# Patient Record
Sex: Female | Born: 1950 | Race: White | Hispanic: No | Marital: Married | Smoking: Never smoker
Health system: Southern US, Community
[De-identification: ages and names within clinical notes are randomized; demographics above are authoritative.]

## PROBLEM LIST (undated history)

## (undated) DIAGNOSIS — C801 Malignant (primary) neoplasm, unspecified: Secondary | ICD-10-CM

## (undated) DIAGNOSIS — I10 Essential (primary) hypertension: Secondary | ICD-10-CM

## (undated) HISTORY — PX: MASTECTOMY: SHX3

---

## 2013-06-28 DIAGNOSIS — I10 Essential (primary) hypertension: Secondary | ICD-10-CM | POA: Diagnosis present

## 2020-02-06 ENCOUNTER — Other Ambulatory Visit: Payer: Self-pay

## 2020-02-06 ENCOUNTER — Emergency Department: Payer: Medicare Other

## 2020-02-06 DIAGNOSIS — R7989 Other specified abnormal findings of blood chemistry: Secondary | ICD-10-CM | POA: Diagnosis present

## 2020-02-06 DIAGNOSIS — Z853 Personal history of malignant neoplasm of breast: Secondary | ICD-10-CM

## 2020-02-06 DIAGNOSIS — K8071 Calculus of gallbladder and bile duct without cholecystitis with obstruction: Secondary | ICD-10-CM | POA: Diagnosis present

## 2020-02-06 DIAGNOSIS — I10 Essential (primary) hypertension: Secondary | ICD-10-CM | POA: Diagnosis present

## 2020-02-06 DIAGNOSIS — Z9012 Acquired absence of left breast and nipple: Secondary | ICD-10-CM

## 2020-02-06 DIAGNOSIS — K8042 Calculus of bile duct with acute cholecystitis without obstruction: Secondary | ICD-10-CM | POA: Diagnosis not present

## 2020-02-06 DIAGNOSIS — Z79890 Hormone replacement therapy (postmenopausal): Secondary | ICD-10-CM

## 2020-02-06 DIAGNOSIS — E876 Hypokalemia: Secondary | ICD-10-CM | POA: Diagnosis present

## 2020-02-06 DIAGNOSIS — Z20822 Contact with and (suspected) exposure to covid-19: Secondary | ICD-10-CM | POA: Diagnosis present

## 2020-02-06 DIAGNOSIS — R0602 Shortness of breath: Secondary | ICD-10-CM | POA: Diagnosis not present

## 2020-02-06 DIAGNOSIS — Z79899 Other long term (current) drug therapy: Secondary | ICD-10-CM

## 2020-02-06 DIAGNOSIS — E039 Hypothyroidism, unspecified: Secondary | ICD-10-CM | POA: Diagnosis present

## 2020-02-06 DIAGNOSIS — Z885 Allergy status to narcotic agent status: Secondary | ICD-10-CM

## 2020-02-06 DIAGNOSIS — Z923 Personal history of irradiation: Secondary | ICD-10-CM

## 2020-02-06 DIAGNOSIS — Z9221 Personal history of antineoplastic chemotherapy: Secondary | ICD-10-CM

## 2020-02-06 LAB — CBC
HCT: 34.1 % — ABNORMAL LOW (ref 36.0–46.0)
Hemoglobin: 11.5 g/dL — ABNORMAL LOW (ref 12.0–15.0)
MCH: 29.1 pg (ref 26.0–34.0)
MCHC: 33.7 g/dL (ref 30.0–36.0)
MCV: 86.3 fL (ref 80.0–100.0)
Platelets: 277 10*3/uL (ref 150–400)
RBC: 3.95 MIL/uL (ref 3.87–5.11)
RDW: 13.1 % (ref 11.5–15.5)
WBC: 6.4 10*3/uL (ref 4.0–10.5)
nRBC: 0 % (ref 0.0–0.2)

## 2020-02-06 LAB — TROPONIN I (HIGH SENSITIVITY)
Troponin I (High Sensitivity): 4 ng/L (ref <18)
Troponin I (High Sensitivity): 5 ng/L (ref <18)

## 2020-02-06 LAB — BASIC METABOLIC PANEL
Anion gap: 11 (ref 5–15)
BUN: 20 mg/dL (ref 8–23)
CO2: 24 mmol/L (ref 22–32)
Calcium: 8.9 mg/dL (ref 8.9–10.3)
Chloride: 104 mmol/L (ref 98–111)
Creatinine, Ser: 1.04 mg/dL — ABNORMAL HIGH (ref 0.44–1.00)
GFR calc Af Amer: >60 mL/min (ref >60)
GFR calc non Af Amer: 55 mL/min — ABNORMAL LOW (ref >60)
Glucose, Bld: 132 mg/dL — ABNORMAL HIGH (ref 70–99)
Potassium: 2.9 mmol/L — ABNORMAL LOW (ref 3.5–5.1)
Sodium: 139 mmol/L (ref 135–145)

## 2020-02-06 NOTE — ED Triage Notes (Signed)
Pt arrives via pov from home with c/o abd pain under rib cage, and chest pain, nausea w/o emesis at this time. Pt state pain started around 30 mins ago and describes it as cramps. NAD noted at this time

## 2020-02-07 ENCOUNTER — Observation Stay: Payer: Medicare Other

## 2020-02-07 ENCOUNTER — Emergency Department: Payer: Medicare Other

## 2020-02-07 ENCOUNTER — Inpatient Hospital Stay
Admission: EM | Admit: 2020-02-07 | Discharge: 2020-02-10 | DRG: 419 | Disposition: A | Discharge status: Home / Self Care | Payer: Medicare Other | Attending: Internal Medicine | Admitting: Internal Medicine

## 2020-02-07 DIAGNOSIS — Z79899 Other long term (current) drug therapy: Secondary | ICD-10-CM | POA: Diagnosis not present

## 2020-02-07 DIAGNOSIS — R0602 Shortness of breath: Secondary | ICD-10-CM | POA: Diagnosis present

## 2020-02-07 DIAGNOSIS — Z853 Personal history of malignant neoplasm of breast: Secondary | ICD-10-CM | POA: Diagnosis not present

## 2020-02-07 DIAGNOSIS — R1011 Right upper quadrant pain: Secondary | ICD-10-CM

## 2020-02-07 DIAGNOSIS — E039 Hypothyroidism, unspecified: Secondary | ICD-10-CM | POA: Diagnosis present

## 2020-02-07 DIAGNOSIS — I10 Essential (primary) hypertension: Secondary | ICD-10-CM | POA: Diagnosis not present

## 2020-02-07 DIAGNOSIS — Z885 Allergy status to narcotic agent status: Secondary | ICD-10-CM | POA: Diagnosis not present

## 2020-02-07 DIAGNOSIS — K8050 Calculus of bile duct without cholangitis or cholecystitis without obstruction: Secondary | ICD-10-CM | POA: Diagnosis not present

## 2020-02-07 DIAGNOSIS — Z20822 Contact with and (suspected) exposure to covid-19: Secondary | ICD-10-CM | POA: Diagnosis present

## 2020-02-07 DIAGNOSIS — E876 Hypokalemia: Secondary | ICD-10-CM | POA: Diagnosis present

## 2020-02-07 DIAGNOSIS — K8071 Calculus of gallbladder and bile duct without cholecystitis with obstruction: Secondary | ICD-10-CM

## 2020-02-07 DIAGNOSIS — R748 Abnormal levels of other serum enzymes: Secondary | ICD-10-CM | POA: Diagnosis not present

## 2020-02-07 DIAGNOSIS — R17 Unspecified jaundice: Secondary | ICD-10-CM

## 2020-02-07 DIAGNOSIS — K8042 Calculus of bile duct with acute cholecystitis without obstruction: Secondary | ICD-10-CM | POA: Diagnosis present

## 2020-02-07 DIAGNOSIS — R7989 Other specified abnormal findings of blood chemistry: Secondary | ICD-10-CM | POA: Diagnosis present

## 2020-02-07 DIAGNOSIS — Z9012 Acquired absence of left breast and nipple: Secondary | ICD-10-CM | POA: Diagnosis not present

## 2020-02-07 DIAGNOSIS — Z9221 Personal history of antineoplastic chemotherapy: Secondary | ICD-10-CM | POA: Diagnosis not present

## 2020-02-07 DIAGNOSIS — Z923 Personal history of irradiation: Secondary | ICD-10-CM | POA: Diagnosis not present

## 2020-02-07 DIAGNOSIS — Z79890 Hormone replacement therapy (postmenopausal): Secondary | ICD-10-CM | POA: Diagnosis not present

## 2020-02-07 HISTORY — DX: Malignant (primary) neoplasm, unspecified: C80.1

## 2020-02-07 HISTORY — DX: Essential (primary) hypertension: I10

## 2020-02-07 LAB — CBC
HCT: 31 % — ABNORMAL LOW (ref 36.0–46.0)
Hemoglobin: 10.4 g/dL — ABNORMAL LOW (ref 12.0–15.0)
MCH: 29.2 pg (ref 26.0–34.0)
MCHC: 33.5 g/dL (ref 30.0–36.0)
MCV: 87.1 fL (ref 80.0–100.0)
Platelets: 211 10*3/uL (ref 150–400)
RBC: 3.56 MIL/uL — ABNORMAL LOW (ref 3.87–5.11)
RDW: 13.3 % (ref 11.5–15.5)
WBC: 5.6 10*3/uL (ref 4.0–10.5)
nRBC: 0 % (ref 0.0–0.2)

## 2020-02-07 LAB — COMPREHENSIVE METABOLIC PANEL
ALT: 123 U/L — ABNORMAL HIGH (ref 0–44)
AST: 275 U/L — ABNORMAL HIGH (ref 15–41)
Albumin: 4.1 g/dL (ref 3.5–5.0)
Alkaline Phosphatase: 157 U/L — ABNORMAL HIGH (ref 38–126)
Anion gap: 12 (ref 5–15)
BUN: 19 mg/dL (ref 8–23)
CO2: 23 mmol/L (ref 22–32)
Calcium: 8.8 mg/dL — ABNORMAL LOW (ref 8.9–10.3)
Chloride: 103 mmol/L (ref 98–111)
Creatinine, Ser: 0.93 mg/dL (ref 0.44–1.00)
GFR calc Af Amer: >60 mL/min (ref >60)
GFR calc non Af Amer: >60 mL/min (ref >60)
Glucose, Bld: 127 mg/dL — ABNORMAL HIGH (ref 70–99)
Potassium: 3.9 mmol/L (ref 3.5–5.1)
Sodium: 138 mmol/L (ref 135–145)
Total Bilirubin: 2.3 mg/dL — ABNORMAL HIGH (ref 0.3–1.2)
Total Protein: 7.9 g/dL (ref 6.5–8.1)

## 2020-02-07 LAB — HEPATIC FUNCTION PANEL
ALT: 55 U/L — ABNORMAL HIGH (ref 0–44)
AST: 176 U/L — ABNORMAL HIGH (ref 15–41)
Albumin: 4.1 g/dL (ref 3.5–5.0)
Alkaline Phosphatase: 121 U/L (ref 38–126)
Bilirubin, Direct: 0.6 mg/dL — ABNORMAL HIGH (ref 0.0–0.2)
Indirect Bilirubin: 0.8 mg/dL (ref 0.3–0.9)
Total Bilirubin: 1.4 mg/dL — ABNORMAL HIGH (ref 0.3–1.2)
Total Protein: 7.9 g/dL (ref 6.5–8.1)

## 2020-02-07 LAB — MAGNESIUM: Magnesium: 1.8 mg/dL (ref 1.7–2.4)

## 2020-02-07 LAB — SARS CORONAVIRUS 2 BY RT PCR (HOSPITAL ORDER, PERFORMED IN ~~LOC~~ HOSPITAL LAB): SARS Coronavirus 2: NEGATIVE

## 2020-02-07 LAB — HIV ANTIBODY (ROUTINE TESTING W REFLEX): HIV Screen 4th Generation wRfx: NONREACTIVE

## 2020-02-07 MED ORDER — LABETALOL HCL 5 MG/ML IV SOLN
10.0000 mg | INTRAVENOUS | Status: DC | PRN
  Administered 2020-02-07: 10 mg via INTRAVENOUS
  Filled 2020-02-07: qty 4

## 2020-02-07 MED ORDER — ACETAMINOPHEN 325 MG PO TABS: 650.0000 mg | ORAL_TABLET | Freq: Four times a day (QID) | ORAL | Status: DC | PRN

## 2020-02-07 MED ORDER — MORPHINE SULFATE (PF) 2 MG/ML IV SOLN
INTRAVENOUS | Status: AC
  Filled 2020-02-07: qty 1

## 2020-02-07 MED ORDER — GADOBUTROL 1 MMOL/ML IV SOLN
7.5000 mL | Freq: Once | INTRAVENOUS | Status: AC | PRN
  Administered 2020-02-07: 7.5 mL via INTRAVENOUS

## 2020-02-07 MED ORDER — ONDANSETRON HCL 4 MG PO TABS
4.0000 mg | ORAL_TABLET | Freq: Four times a day (QID) | ORAL | Status: DC | PRN
  Administered 2020-02-08: 4 mg via ORAL
  Filled 2020-02-07: qty 1

## 2020-02-07 MED ORDER — POTASSIUM CHLORIDE CRYS ER 20 MEQ PO TBCR: 40.0000 meq | EXTENDED_RELEASE_TABLET | Freq: Once | ORAL | Status: DC

## 2020-02-07 MED ORDER — POTASSIUM CHLORIDE 10 MEQ/100ML IV SOLN
10.0000 meq | INTRAVENOUS | Status: AC
  Administered 2020-02-07 (×2): 10 meq via INTRAVENOUS
  Filled 2020-02-07 (×3): qty 100

## 2020-02-07 MED ORDER — MORPHINE SULFATE (PF) 2 MG/ML IV SOLN: 2.0000 mg | INTRAVENOUS | Status: DC | PRN

## 2020-02-07 MED ORDER — SODIUM CHLORIDE 0.9 % IV BOLUS
1000.0000 mL | Freq: Once | INTRAVENOUS | Status: AC
  Administered 2020-02-07: 1000 mL via INTRAVENOUS

## 2020-02-07 MED ORDER — ACETAMINOPHEN 650 MG RE SUPP: 650.0000 mg | Freq: Four times a day (QID) | RECTAL | Status: DC | PRN

## 2020-02-07 MED ORDER — MORPHINE SULFATE (PF) 2 MG/ML IV SOLN
2.0000 mg | Freq: Once | INTRAVENOUS | Status: AC
  Administered 2020-02-07: 2 mg via INTRAVENOUS

## 2020-02-07 MED ORDER — PIPERACILLIN-TAZOBACTAM 3.375 G IVPB
3.3750 g | Freq: Three times a day (TID) | INTRAVENOUS | Status: DC
  Administered 2020-02-07 – 2020-02-10 (×10): 3.375 g via INTRAVENOUS
  Filled 2020-02-07 (×10): qty 50

## 2020-02-07 MED ORDER — SODIUM CHLORIDE 0.9 % IV SOLN: INTRAVENOUS | Status: DC

## 2020-02-07 MED ORDER — ONDANSETRON HCL 4 MG/2ML IJ SOLN
4.0000 mg | Freq: Once | INTRAMUSCULAR | Status: AC
  Administered 2020-02-07: 4 mg via INTRAVENOUS
  Filled 2020-02-07: qty 2

## 2020-02-07 MED ORDER — ONDANSETRON HCL 4 MG/2ML IJ SOLN
4.0000 mg | Freq: Four times a day (QID) | INTRAMUSCULAR | Status: DC | PRN
  Administered 2020-02-09: 4 mg via INTRAVENOUS

## 2020-02-07 NOTE — ED Notes (Signed)
MD assessing patient in triage 3 with this RN and IT trainer. Patient c/o upper abdominal pain and emesis after eating at [facility]. Patient currently at 4-5/10 abdominal pain. Patient reports 3 emeses in the last 24 hours.

## 2020-02-07 NOTE — ED Notes (Signed)
Provider notified of BP of 173/94.

## 2020-02-07 NOTE — Progress Notes (Signed)
PROGRESS NOTE    Brittney Harris  WCH:[id number] DOB: [birth year] DOA: 02/07/2020 PCP: System, Provider Not In    Assessment & Plan:   Active Problems:   Choledocholithiasis   Hypertension   Hypothyroidism    Brittney Harris is a 68 y.o. female with medical history significant for HTN, hypothyroidism and history of left breast cancer status post chemoradiation and mastectomy with reconstruction in 2012 who presents to the emergency room with sudden onset mid and right upper quadrant pain of moderate to severe intensity, nonradiating, crampy in nature that started after eating at a restaurant.  It was associated with 3 episodes of vomiting of gastric contents.  No associated diarrhea.  No fever or chills.    Acute biliary colic secondary to choledocholithiasis -Patient presents with sudden onset RUQ pain and vomiting following a meal at a restaurant with elevated LFTs on work-up and dilated CBD on ultrasound --zosyn started on presentation -MRCP showed 3 mm stone in the common bile duct PLAN: --continue zosyn --pain control, IV antiemetics --continue MIVF@100  -clear liquid diet --ERCP tomorrow --consider surgery consult to evaluate for cholecystectomy to prevent future episodes of choledocholithiasis  Hypokalemia -recheck and replete PRN    Hypertension -Not on regular BP medication at home.  BP labile. --Hold BP meds for now.    Hypothyroidism -resume home synthroid   DVT prophylaxis: SCD/Compression stockings Code Status: Full code  Family Communication:  Status is: inpatient Dispo:   The patient is from: home Anticipated d/c is to: home Anticipated d/c date is: 2-3 days Patient currently is not medically stable to d/c due to: need ERCP tomorrow   Subjective and Interval History:  Pt reported improvement in N/V and abdominal pain.     Objective: Vitals:   02/07/20 1800 02/07/20 2133 02/07/20 2345 02/08/20 0000  BP: (!) 150/77 (!) 141/76  (!) 142/69   Pulse: 73 76 79   Resp: 18 18    Temp:      TempSrc:      SpO2: 100% 98% 95%   Weight:      Height:       No intake or output data in the 24 hours ending 02/08/20 0135 Filed Weights   02/06/20 1529  Weight: 90.7 kg    Examination:   Constitutional: NAD, AAOx3 HEENT: conjunctivae and lids normal, EOMI CV: RRR no M,R,G. Distal pulses +2.  No cyanosis.   RESP: CTA B/L, normal respiratory effort  GI: +BS, NTND, soft Extremities: No effusions, edema, or tenderness in BLE SKIN: warm, dry and intact Neuro: II - XII grossly intact.  Sensation intact Psych: Normal mood and affect.  Appropriate judgement and reason   Data Reviewed: I have personally reviewed following labs and imaging studies  CBC: Recent Labs  Lab 02/06/20 1535 02/07/20 1111  WBC 6.4 5.6  HGB 11.5* 10.4*  HCT 34.1* 31.0*  MCV 86.3 87.1  PLT 277 353   Basic Metabolic Panel: Recent Labs  Lab 02/06/20 1535 02/07/20 0326 02/07/20 1111  NA 139 138  --   K 2.9* 3.9  --   CL 104 103  --   CO2 24 23  --   GLUCOSE 132* 127*  --   BUN 20 19  --   CREATININE 1.04* 0.93  --   CALCIUM 8.9 8.8*  --   MG  --   --  1.8   GFR: Estimated Creatinine Clearance: 62.6 mL/min (by C-G formula based on SCr of 0.93 mg/dL). Liver Function Tests: Recent  Labs  Lab 02/06/20 1809 02/07/20 0326  AST 176* 275*  ALT 55* 123*  ALKPHOS 121 157*  BILITOT 1.4* 2.3*  PROT 7.9 7.9  ALBUMIN 4.1 4.1   No results for input(s): LIPASE, AMYLASE in the last 168 hours. No results for input(s): AMMONIA in the last 168 hours. Coagulation Profile: No results for input(s): INR, PROTIME in the last 168 hours. Cardiac Enzymes: No results for input(s): CKTOTAL, CKMB, CKMBINDEX, TROPONINI in the last 168 hours. BNP (last 3 results) No results for input(s): PROBNP in the last 8760 hours. HbA1C: No results for input(s): HGBA1C in the last 72 hours. CBG: No results for input(s): GLUCAP in the last 168 hours. Lipid Profile: No  results for input(s): CHOL, HDL, LDLCALC, TRIG, CHOLHDL, LDLDIRECT in the last 72 hours. Thyroid Function Tests: No results for input(s): TSH, T4TOTAL, FREET4, T3FREE, THYROIDAB in the last 72 hours. Anemia Panel: No results for input(s): VITAMINB12, FOLATE, FERRITIN, TIBC, IRON, RETICCTPCT in the last 72 hours. Sepsis Labs: No results for input(s): PROCALCITON, LATICACIDVEN in the last 168 hours.  Recent Results (from the past 240 hour(s))  SARS Coronavirus 2 by RT PCR (hospital order, performed in [facility] hospital lab) Nasopharyngeal Nasopharyngeal Swab     Status: None   Collection Time: 02/07/20  3:26 AM   Specimen: Nasopharyngeal Swab  Result Value Ref Range Status   SARS Coronavirus 2 NEGATIVE NEGATIVE Final    Comment: (NOTE) SARS-CoV-2 target nucleic acids are NOT DETECTED.  The SARS-CoV-2 RNA is generally detectable in upper and lower respiratory specimens during the acute phase of infection. The lowest concentration of SARS-CoV-2 viral copies this assay can detect is 250 copies / mL. A negative result does not preclude SARS-CoV-2 infection and should not be used as the sole basis for treatment or other patient management decisions.  A negative result may occur with improper specimen collection / handling, submission of specimen other than nasopharyngeal swab, presence of viral mutation(s) within the areas targeted by this assay, and inadequate number of viral copies (<250 copies / mL). A negative result must be combined with clinical observations, patient history, and epidemiological information.  Fact Sheet for Patients:   StrictlyIdeas.no  Fact Sheet for Healthcare Providers: BankingDealers.co.za  This test is not yet approved or  cleared by the [place] FDA and has been authorized for detection and/or diagnosis of SARS-CoV-2 by FDA under an Emergency Use Authorization (EUA).  This EUA will remain in effect  (meaning this test can be used) for the duration of the COVID-19 declaration under Section 564(b)(1) of the Act, 21 U.S.C. section 360bbb-3(b)(1), unless the authorization is terminated or revoked sooner.  Performed at [facility], [address], [place], [place] [address]       Radiology Studies: DG Chest 2 View  Result Date: 02/06/2020 CLINICAL DATA:  Acute shortness of breath. EXAM: CHEST - 2 VIEW COMPARISON:  None. FINDINGS: The cardiomediastinal silhouette is unremarkable. Surgical changes and scarring overlying the UPPER LEFT lung noted. There is no evidence of focal airspace disease, pulmonary edema, suspicious pulmonary nodule/mass, pleural effusion, or pneumothorax. No acute bony abnormalities are identified. IMPRESSION: No active cardiopulmonary disease. Electronically Signed   By: Margarette Canada M.D.   On: 02/06/2020 16:17   MR 3D Recon At Scanner  Result Date: 02/07/2020 CLINICAL DATA:  History of breast cancer. Acute onset of right upper abdominal pain with nausea. EXAM: MRI ABDOMEN WITHOUT AND WITH CONTRAST (INCLUDING MRCP) TECHNIQUE: Multiplanar multisequence MR imaging of the abdomen  was performed both before and after the administration of intravenous contrast. Heavily T2-weighted images of the biliary and pancreatic ducts were obtained, and three-dimensional MRCP images were rendered by post processing. CONTRAST:  7.2mL GADAVIST GADOBUTROL 1 MMOL/ML IV SOLN COMPARISON:  None. FINDINGS: Lower chest: No acute findings. Hepatobiliary: No focal liver lesions identified. There are multiple small stones identified layering within the gallbladder measuring up to 4 mm. Gallbladder wall is upper limits of normal in thickness measuring 3 mm. No surrounding inflammatory changes. Mild intrahepatic biliary dilatation. Fusiform dilatation of the common bile duct is identified which measures up to 1.2 cm, image 11/4. There may be a single tiny stone identified within the distal common  bile duct measuring approximately 3 mm, image 11/14. No additional signs of choledocholithiasis or obstructing mass identified. Pancreas: The main duct is upper limits of normal caliber measuring 3 mm, image 41/2. No pancreatic inflammation or mass identified. Spleen:  Within normal limits in size and appearance. Adrenals/Urinary Tract: No masses identified. No evidence of hydronephrosis. Stomach/Bowel: Small to the moderate size hiatal hernia noted. The visualized bowel loops within the upper abdomen are unremarkable. Vascular/Lymphatic: No pathologically enlarged lymph nodes identified. No abdominal aortic aneurysm demonstrated. Other:  No free fluid or fluid collections Musculoskeletal: No suspicious bone lesions identified. IMPRESSION: 1. Gallstones. Gallbladder wall is upper limits of normal in thickness. No surrounding inflammatory changes to suggest acute cholecystitis. 2. Fusiform dilatation of the common bile duct which measures up to 1.2 cm. Mild intrahepatic biliary dilatation is also noted. Suspect a single tiny stone identified with the distal common bile duct measuring approximately 3 mm. No additional signs of choledocholithiasis or obstructing mass identified. 3. Small to moderate size hiatal hernia. Electronically Signed   By: Kerby Moors M.D.   On: 02/07/2020 06:40   MR ABDOMEN MRCP W WO CONTAST  Result Date: 02/07/2020 CLINICAL DATA:  History of breast cancer. Acute onset of right upper abdominal pain with nausea. EXAM: MRI ABDOMEN WITHOUT AND WITH CONTRAST (INCLUDING MRCP) TECHNIQUE: Multiplanar multisequence MR imaging of the abdomen was performed both before and after the administration of intravenous contrast. Heavily T2-weighted images of the biliary and pancreatic ducts were obtained, and three-dimensional MRCP images were rendered by post processing. CONTRAST:  7.43mL GADAVIST GADOBUTROL 1 MMOL/ML IV SOLN COMPARISON:  None. FINDINGS: Lower chest: No acute findings. Hepatobiliary: No  focal liver lesions identified. There are multiple small stones identified layering within the gallbladder measuring up to 4 mm. Gallbladder wall is upper limits of normal in thickness measuring 3 mm. No surrounding inflammatory changes. Mild intrahepatic biliary dilatation. Fusiform dilatation of the common bile duct is identified which measures up to 1.2 cm, image 11/4. There may be a single tiny stone identified within the distal common bile duct measuring approximately 3 mm, image 11/14. No additional signs of choledocholithiasis or obstructing mass identified. Pancreas: The main duct is upper limits of normal caliber measuring 3 mm, image 41/2. No pancreatic inflammation or mass identified. Spleen:  Within normal limits in size and appearance. Adrenals/Urinary Tract: No masses identified. No evidence of hydronephrosis. Stomach/Bowel: Small to the moderate size hiatal hernia noted. The visualized bowel loops within the upper abdomen are unremarkable. Vascular/Lymphatic: No pathologically enlarged lymph nodes identified. No abdominal aortic aneurysm demonstrated. Other:  No free fluid or fluid collections Musculoskeletal: No suspicious bone lesions identified. IMPRESSION: 1. Gallstones. Gallbladder wall is upper limits of normal in thickness. No surrounding inflammatory changes to suggest acute cholecystitis. 2. Fusiform dilatation of the common  bile duct which measures up to 1.2 cm. Mild intrahepatic biliary dilatation is also noted. Suspect a single tiny stone identified with the distal common bile duct measuring approximately 3 mm. No additional signs of choledocholithiasis or obstructing mass identified. 3. Small to moderate size hiatal hernia. Electronically Signed   By: Kerby Moors M.D.   On: 02/07/2020 06:40   US ABDOMEN LIMITED RUQ  Result Date: 02/07/2020 CLINICAL DATA:  69 year old female with right upper quadrant abdominal pain. EXAM: ULTRASOUND ABDOMEN LIMITED RIGHT UPPER QUADRANT COMPARISON:   None. FINDINGS: Gallbladder: There multiple stones within the gallbladder. No gallbladder wall thickening or pericholecystic fluid. Negative sonographic Murphy's sign. Several echogenic foci with comet tail artifact in the region of the gallbladder fundus consistent with adenomyomatosis. Common bile duct: Diameter: 11 mm. The common bile duct is distended. No stone identified in the visualized CBD. Liver: The liver is grossly unremarkable. Portal vein is patent on color Doppler imaging with normal direction of blood flow towards the liver. Other: None. IMPRESSION: 1. Cholelithiasis without sonographic evidence of acute cholecystitis. 2. Dilated common bile duct. No stone identified in the visualized CBD. A centrally obstructing calculus is not excluded. Electronically Signed   By: Anner Crete M.D.   On: 02/07/2020 01:32     Scheduled Meds: Continuous Infusions: . sodium chloride 100 mL/hr at 02/08/20 0054  . piperacillin-tazobactam Stopped (02/08/20 0054)     LOS: 1 day     Enzo Bi, MD [facility] If 7PM-7AM, please contact night-coverage 02/08/2020, 1:35 AM

## 2020-02-07 NOTE — H&P (Signed)
History and Physical    Brittney Harris ZOX:[id number] DOB: 06-29-1950 DOA: 02/07/2020  PCP: No primary care provider on file.   Patient coming from: Home  I have personally briefly reviewed patient's old medical records in [place]  Chief Complaint: Abdominal pain  HPI: Brittney Harris is a 69 y.o. female with medical history significant for HTN, hypothyroidism and history of left breast cancer status post chemoradiation and mastectomy with reconstruction in 2012 who presents to the emergency room with sudden onset mid and right upper quadrant pain of moderate to severe intensity, nonradiating, crampy in nature that started after eating at a restaurant.  It was associated with 3 episodes of vomiting of gastric contents.  No associated diarrhea.  No fever or chills.  No chest pain shortness of breath and no cough.  No affected contacts. ED Course: On arrival, borderline low blood pressure of 95/67 with otherwise normal vitals.  Blood work significant for hype Po kalemia of 2.9, mildly elevated creatinine of 1.04.  AST 176, ALT 55, total bilirubin 1.4, hemoglobin 11.5. Right upper quadrant sonogram showed cholelithiasis without evidence of acute cholecystitis as well as a dilated common bile duct of 11 mm but no stone identified.  The emergency room provider spoke with GI, Dr. Bonna Gains who recommended MRCP for consideration of possible ERCP based on findings.  Patient started on IV Zosyn.  Hospitalist consulted for admission.  Review of Systems: As per HPI otherwise all other systems on review of systems negative.    Past Medical History:  Diagnosis Date  . Cancer [facility])     Past Surgical History:  Procedure Laterality Date  . MASTECTOMY Left      reports that she has never smoked. She has never used smokeless tobacco. She reports that she does not drink alcohol and does not use drugs.  Not on File  History reviewed. No pertinent family history.    Prior to Admission  medications   Not on File    Physical Exam: Vitals:   02/06/20 1528 02/06/20 1529 02/06/20 2342 02/07/20 0317  BP: 95/67  (!) 184/77 (!) 156/100  Pulse: (!) 57  74 87  Resp: 18  18 16   Temp: 97.6 F (36.4 C)     TempSrc: Oral     SpO2: 100%  99% 98%  Weight:  90.7 kg    Height:  5\' 5"  (1.651 m)       Vitals:   02/06/20 1528 02/06/20 1529 02/06/20 2342 02/07/20 0317  BP: 95/67  (!) 184/77 (!) 156/100  Pulse: (!) 57  74 87  Resp: 18  18 16   Temp: 97.6 F (36.4 C)     TempSrc: Oral     SpO2: 100%  99% 98%  Weight:  90.7 kg    Height:  5\' 5"  (1.651 m)        Constitutional: Alert and oriented x 3 . Not in any apparent distress HEENT:      Head: Normocephalic and atraumatic.         Eyes: PERLA, EOMI, Conjunctivae are normal. Sclera is non-icteric.       Mouth/Throat: Mucous membranes are moist.       Neck: Supple with no signs of meningismus. Cardiovascular: Regular rate and rhythm. No murmurs, gallops, or rubs. 2+ symmetrical distal pulses are present . No JVD. No LE edema Respiratory: Respiratory effort normal .Lungs sounds clear bilaterally. No wheezes, crackles, or rhonchi.  Gastrointestinal:  RUQ tenderness, mild, and non distended with  positive bowel sounds. Genitourinary: No CVA tenderness. Musculoskeletal: Nontender with normal range of motion in all extremities. No cyanosis, or erythema of extremities. Neurologic: Normal speech and language. Face is symmetric. Moving all extremities. No gross focal neurologic deficits . Skin: Skin is warm, dry.  No rash or ulcers Psychiatric: Mood and affect are normal Speech and behavior are normal   Labs on Admission: I have personally reviewed following labs and imaging studies  CBC: Recent Labs  Lab 02/06/20 1535  WBC 6.4  HGB 11.5*  HCT 34.1*  MCV 86.3  PLT 329   Basic Metabolic Panel: Recent Labs  Lab 02/06/20 1535  NA 139  K 2.9*  CL 104  CO2 24  GLUCOSE 132*  BUN 20  CREATININE 1.04*  CALCIUM 8.9    GFR: Estimated Creatinine Clearance: 56.8 mL/min (A) (by C-G formula based on SCr of 1.04 mg/dL (H)). Liver Function Tests: Recent Labs  Lab 02/06/20 1809  AST 176*  ALT 55*  ALKPHOS 121  BILITOT 1.4*  PROT 7.9  ALBUMIN 4.1   No results for input(s): LIPASE, AMYLASE in the last 168 hours. No results for input(s): AMMONIA in the last 168 hours. Coagulation Profile: No results for input(s): INR, PROTIME in the last 168 hours. Cardiac Enzymes: No results for input(s): CKTOTAL, CKMB, CKMBINDEX, TROPONINI in the last 168 hours. BNP (last 3 results) No results for input(s): PROBNP in the last 8760 hours. HbA1C: No results for input(s): HGBA1C in the last 72 hours. CBG: No results for input(s): GLUCAP in the last 168 hours. Lipid Profile: No results for input(s): CHOL, HDL, LDLCALC, TRIG, CHOLHDL, LDLDIRECT in the last 72 hours. Thyroid Function Tests: No results for input(s): TSH, T4TOTAL, FREET4, T3FREE, THYROIDAB in the last 72 hours. Anemia Panel: No results for input(s): VITAMINB12, FOLATE, FERRITIN, TIBC, IRON, RETICCTPCT in the last 72 hours. Urine analysis: No results found for: COLORURINE, APPEARANCEUR, LABSPEC, PHURINE, GLUCOSEU, HGBUR, BILIRUBINUR, KETONESUR, PROTEINUR, UROBILINOGEN, NITRITE, LEUKOCYTESUR  Radiological Exams on Admission: DG Chest 2 View  Result Date: 02/06/2020 CLINICAL DATA:  Acute shortness of breath. EXAM: CHEST - 2 VIEW COMPARISON:  None. FINDINGS: The cardiomediastinal silhouette is unremarkable. Surgical changes and scarring overlying the UPPER LEFT lung noted. There is no evidence of focal airspace disease, pulmonary edema, suspicious pulmonary nodule/mass, pleural effusion, or pneumothorax. No acute bony abnormalities are identified. IMPRESSION: No active cardiopulmonary disease. Electronically Signed   By: Margarette Canada M.D.   On: 02/06/2020 16:17   US ABDOMEN LIMITED RUQ  Result Date: 02/07/2020 CLINICAL DATA:  68 year old female with right  upper quadrant abdominal pain. EXAM: ULTRASOUND ABDOMEN LIMITED RIGHT UPPER QUADRANT COMPARISON:  None. FINDINGS: Gallbladder: There multiple stones within the gallbladder. No gallbladder wall thickening or pericholecystic fluid. Negative sonographic Murphy's sign. Several echogenic foci with comet tail artifact in the region of the gallbladder fundus consistent with adenomyomatosis. Common bile duct: Diameter: 11 mm. The common bile duct is distended. No stone identified in the visualized CBD. Liver: The liver is grossly unremarkable. Portal vein is patent on color Doppler imaging with normal direction of blood flow towards the liver. Other: None. IMPRESSION: 1. Cholelithiasis without sonographic evidence of acute cholecystitis. 2. Dilated common bile duct. No stone identified in the visualized CBD. A centrally obstructing calculus is not excluded. Electronically Signed   By: Anner Crete M.D.   On: 02/07/2020 01:32    Assessment/Plan 69 year old female with a history of HTN, hypothyroidism and breast cancer status postmastectomy presenting with abdominal pain and vomiting  Acute biliary colic secondary to choledocholithiasis -Patient presents with sudden onset RUQ pain and vomiting following a meal at a restaurant with elevated LFTs on work-up and dilated CBD on ultrasound -Follow-up MRCP ordered from the emergency room -IV Zosyn, pain control, IV antiemetics -IV fluids -GI consulted  Hypokalemia -IV supplementation    Hypertension -Hold home antihypertensives as BP borderline low at 95/67 on arrival    Hypothyroidism -Hold home meds for now as n.p.o.    DVT prophylaxis: SCDs, as possible procedure in the a.m. Code Status: full code  Family Communication:  none  Disposition Plan: Back to previous home environment Consults called: GI Status: Observation    Athena Masse MD [facility]     02/07/2020, 3:23 AM

## 2020-02-07 NOTE — ED Provider Notes (Signed)
[facility] Emergency Department Provider Note  ____________________________________________   First MD Initiated Contact with Patient 02/07/20 [phone]     (approximate)  I have reviewed the triage vital signs and the nursing notes.   HISTORY  Chief Complaint Chest Pain and Shortness of Breath   HPI Brittney Harris is a 69 y.o. female with history of breast cancer presents to the emergency department secondary to acute onset of right upper abdominal pain with associated nausea 30 minutes before arrival to the emergency department.  Patient states that she ate approximately 1 hour before onset of symptoms.  Patient admits to vomiting however no diarrhea.  Patient denies any fever.  Patient denies any constipation.  Patient denies any urinary symptoms.        Past Medical History:  Diagnosis Date  . Cancer [facility])     Patient Active Problem List   Diagnosis Date Noted  . Choledocholithiasis 02/07/2020  . Hypothyroidism 02/07/2020  . History of left breast cancer 02/07/2020  . Hypertension 06/28/2013    Past Surgical History:  Procedure Laterality Date  . MASTECTOMY Left     Prior to Admission medications   Medication Sig Start Date End Date Taking? Authorizing Provider  levothyroxine (SYNTHROID) 100 MCG tablet Take 100 mcg by mouth daily. 02/06/20  Yes [provider]    Allergies Patient has no allergy information on record.  History reviewed. No pertinent family history.  Social History Social History   Tobacco Use  . Smoking status: Never Smoker  . Smokeless tobacco: Never Used  Vaping Use  . Vaping Use: Never used  Substance Use Topics  . Alcohol use: Never  . Drug use: Never    Review of Systems Constitutional: No fever/chills Eyes: No visual changes. ENT: No sore throat. Cardiovascular: Denies chest pain. Respiratory: Denies shortness of breath. Gastrointestinal: Positive for abdominal pain and  nausea. Genitourinary: Negative for dysuria. Musculoskeletal: Negative for neck pain.  Negative for back pain. Integumentary: Negative for rash. Neurological: Negative for headaches, focal weakness or numbness.  ____________________________________________   PHYSICAL EXAM:  VITAL SIGNS: ED Triage Vitals  Enc Vitals Group     BP 02/06/20 1528 95/67     Pulse Rate 02/06/20 1528 (!) 57     Resp 02/06/20 1528 18     Temp 02/06/20 1528 97.6 F (36.4 C)     Temp Source 02/06/20 1528 Oral     SpO2 02/06/20 1528 100 %     Weight 02/06/20 1529 90.7 kg (200 lb)     Height 02/06/20 1529 1.651 m (5\' 5" )     Head Circumference --      Peak Flow --      Pain Score 02/06/20 1529 6     Pain Loc --      Pain Edu? --      Excl. in [place]? --     Constitutional: Alert and oriented.  Eyes: Conjunctivae are normal.  Head: Atraumatic. Mouth/Throat: Patient is wearing a mask. Neck: No stridor.  No meningeal signs.   Cardiovascular: Normal rate, regular rhythm. Good peripheral circulation. Grossly normal heart sounds. Respiratory: Normal respiratory effort.  No retractions. Gastrointestinal: Right upper quadrant tenderness to palpation, no guarding or rebound. Musculoskeletal: No lower extremity tenderness nor edema. No gross deformities of extremities. Neurologic:  Normal speech and language. No gross focal neurologic deficits are appreciated.  Skin:  Skin is warm, dry and intact. Psychiatric: Mood and affect are normal. Speech and behavior are normal.  ____________________________________________   LABS (all labs ordered are listed, but only abnormal results are displayed)  Labs Reviewed  BASIC METABOLIC PANEL - Abnormal; Notable for the following components:      Result Value   Potassium 2.9 (*)    Glucose, Bld 132 (*)    Creatinine, Ser 1.04 (*)    GFR calc non Af Amer 55 (*)    All other components within normal limits  CBC - Abnormal; Notable for the following components:    Hemoglobin 11.5 (*)    HCT 34.1 (*)    All other components within normal limits  HEPATIC FUNCTION PANEL - Abnormal; Notable for the following components:   AST 176 (*)    ALT 55 (*)    Total Bilirubin 1.4 (*)    Bilirubin, Direct 0.6 (*)    All other components within normal limits  COMPREHENSIVE METABOLIC PANEL  HIV ANTIBODY (ROUTINE TESTING W REFLEX)  TROPONIN I (HIGH SENSITIVITY)  TROPONIN I (HIGH SENSITIVITY)   _______________________________________  RADIOLOGY I, Viola N Anvith Mauriello, personally viewed and evaluated these images (plain radiographs) as part of my medical decision making, as well as reviewing the written report by the radiologist.  ED MD interpretation: No active cardiopulmonary disease noted on chest x-ray per radiologist.  Right upper quadrant ultrasound revealed cholelithiasis without evidence of acute cholecystitis.  Dilated common bile duct at 11 mm.  No stones visualized in the CBD per radiologist.  Official radiology report(s): DG Chest 2 View  Result Date: 02/06/2020 CLINICAL DATA:  Acute shortness of breath. EXAM: CHEST - 2 VIEW COMPARISON:  None. FINDINGS: The cardiomediastinal silhouette is unremarkable. Surgical changes and scarring overlying the UPPER LEFT lung noted. There is no evidence of focal airspace disease, pulmonary edema, suspicious pulmonary nodule/mass, pleural effusion, or pneumothorax. No acute bony abnormalities are identified. IMPRESSION: No active cardiopulmonary disease. Electronically Signed   By: Margarette Canada M.D.   On: 02/06/2020 16:17   US ABDOMEN LIMITED RUQ  Result Date: 02/07/2020 CLINICAL DATA:  69 year old female with right upper quadrant abdominal pain. EXAM: ULTRASOUND ABDOMEN LIMITED RIGHT UPPER QUADRANT COMPARISON:  None. FINDINGS: Gallbladder: There multiple stones within the gallbladder. No gallbladder wall thickening or pericholecystic fluid. Negative sonographic Murphy's sign. Several echogenic foci with comet tail artifact  in the region of the gallbladder fundus consistent with adenomyomatosis. Common bile duct: Diameter: 11 mm. The common bile duct is distended. No stone identified in the visualized CBD. Liver: The liver is grossly unremarkable. Portal vein is patent on color Doppler imaging with normal direction of blood flow towards the liver. Other: None. IMPRESSION: 1. Cholelithiasis without sonographic evidence of acute cholecystitis. 2. Dilated common bile duct. No stone identified in the visualized CBD. A centrally obstructing calculus is not excluded. Electronically Signed   By: Anner Crete M.D.   On: 02/07/2020 01:32     Procedures   ____________________________________________   INITIAL IMPRESSION / MDM / ASSESSMENT AND PLAN / ED COURSE  As part of my medical decision making, I reviewed the following data within the electronic MEDICAL RECORD NUMBER  69 year old female presented with above-stated history and physical exam a differential diagnosis including but not limited to cholelithiasis, choledocholithiasis, colitis, gastritis.  Laboratory data notable for elevated AST and ALT of 176 and 55.  Potassium noted 2.9.  Patient given IV morphine for pain 2 mg.  Patient given IV Zofran 4 mg for nausea.  Patient will be given 40 mEq of p.o. potassium.  Patient discussed with Dr. Danton Sewer  neurology as well as Dr. Damita Dunnings hospitalist for hospital admission for further evaluation and management. ____________________________________________  FINAL CLINICAL IMPRESSION(S) / ED DIAGNOSES  Final diagnoses:  RUQ abdominal pain  Calculus of gallbladder and bile duct with obstruction without cholecystitis     MEDICATIONS GIVEN DURING THIS VISIT:  Medications  0.9 %  sodium chloride infusion (has no administration in time range)  acetaminophen (TYLENOL) tablet 650 mg (has no administration in time range)    Or  acetaminophen (TYLENOL) suppository 650 mg (has no administration in time range)  morphine 2  MG/ML injection 2 mg (has no administration in time range)  ondansetron (ZOFRAN) tablet 4 mg (has no administration in time range)    Or  ondansetron (ZOFRAN) injection 4 mg (has no administration in time range)  potassium chloride 10 mEq in 100 mL IVPB (has no administration in time range)  ondansetron (ZOFRAN) injection 4 mg (4 mg Intravenous Given 02/07/20 0034)  sodium chloride 0.9 % bolus 1,000 mL (0 mLs Intravenous Stopped 02/07/20 0325)  morphine 2 MG/ML injection 2 mg (2 mg Intravenous Given 02/07/20 0323)     ED Discharge Orders    None      *Please note:  Brittney Harris was evaluated in Emergency Department on 02/07/2020 for the symptoms described in the history of present illness. She was evaluated in the context of the global COVID-19 pandemic, which necessitated consideration that the patient might be at risk for infection with the SARS-CoV-2 virus that causes COVID-19. Institutional protocols and algorithms that pertain to the evaluation of patients at risk for COVID-19 are in a state of rapid change based on information released by regulatory bodies including the CDC and federal and state organizations. These policies and algorithms were followed during the patient's care in the ED.  Some ED evaluations and interventions may be delayed as a result of limited staffing during and after the pandemic.*  Note:  This document was prepared using Dragon voice recognition software and may include unintentional dictation errors.   Gregor Hams, MD 02/07/20 [phone]

## 2020-02-07 NOTE — Consult Note (Signed)
Vonda Antigua, MD [address], [place], [place], [place], [address] [address], [place], [place], [place], [address] Phone: [phone]  Fax: [phone]  Consultation  Referring Provider:     Dr. Billie Ruddy Primary Care Physician:  System, Provider Not In Reason for Consultation:     Choledocholithiasis  Date of Admission:  02/07/2020 Date of Consultation:  02/07/2020         HPI:   Brittney Harris is a 69 y.o. female presents with 1 day history of right upper quadrant abdominal pain with no previous history of the same.  Nausea but no vomiting.  Lab work showed elevated liver enzymes, including bilirubin and right upper quadrant ultrasound showed CBD dilation.  MRCP shows 3 mm distal common bile duct stone and gallstones.  No cholecystitis  Patient describes the pain is dull, nonradiating, 5/10.  No fever or chills.  Pain was relieved with pain medication in the ER  Past Medical History:  Diagnosis Date   Cancer [facility])     Past Surgical History:  Procedure Laterality Date   MASTECTOMY Left     Prior to Admission medications   Medication Sig Start Date End Date Taking? Authorizing Provider  furosemide (LASIX) 20 MG tablet Take 10 mg by mouth at bedtime as needed for fluid.   Yes [provider]  levothyroxine (SYNTHROID) 100 MCG tablet Take 100 mcg by mouth daily. 02/06/20  Yes [provider]    History reviewed. No pertinent family history.   Social History   Tobacco Use   Smoking status: Never Smoker   Smokeless tobacco: Never Used  Vaping Use   Vaping Use: Never used  Substance Use Topics   Alcohol use: Never   Drug use: Never    Allergies as of 02/06/2020   (Not on File)    Review of Systems:    All systems reviewed and negative except where noted in HPI.   Physical Exam:  Vital signs in last 24 hours: Vitals:   02/07/20 0430 02/07/20 0630 02/07/20 0730 02/07/20 0800  BP: (!) 171/91 (!) 163/89 (!) 143/75 (!) 152/80    Pulse: 80 81 78 73  Resp: (!) 24 (!) 22 17 16   Temp:    98.5 F (36.9 C)  TempSrc:    Oral  SpO2: 95% 98% 96% 97%  Weight:      Height:         General:   Pleasant, cooperative in NAD Head:  Normocephalic and atraumatic. Eyes:   No icterus.   Conjunctiva pink. PERRLA. Ears:  Normal auditory acuity. Neck:  Supple; no masses or thyroidomegaly Lungs: Respirations even and unlabored. Lungs clear to auscultation bilaterally.   No wheezes, crackles, or rhonchi.  Abdomen:  Soft, nondistended, nontender. Normal bowel sounds. No appreciable masses or hepatomegaly.  No rebound or guarding.  Neurologic:  Alert and oriented x3;  grossly normal neurologically. Skin:  Intact without significant lesions or rashes. Cervical Nodes:  No significant cervical adenopathy. Psych:  Alert and cooperative. Normal affect.  LAB RESULTS: Recent Labs    02/06/20 1535 02/07/20 1111  WBC 6.4 5.6  HGB 11.5* 10.4*  HCT 34.1* 31.0*  PLT 277 211   BMET Recent Labs    02/06/20 1535 02/07/20 0326  NA 139 138  K 2.9* 3.9  CL 104 103  CO2 24 23  GLUCOSE 132* 127*  BUN 20 19  CREATININE 1.04* 0.93  CALCIUM 8.9 8.8*   LFT Recent Labs  02/06/20 1809 02/06/20 1809 02/07/20 0326  PROT 7.9   < > 7.9  ALBUMIN 4.1   < > 4.1  AST 176*   < > 275*  ALT 55*   < > 123*  ALKPHOS 121   < > 157*  BILITOT 1.4*   < > 2.3*  BILIDIR 0.6*  --   --   IBILI 0.8  --   --    < > = values in this interval not displayed.   PT/INR No results for input(s): LABPROT, INR in the last 72 hours.  STUDIES: DG Chest 2 View  Result Date: 02/06/2020 CLINICAL DATA:  Acute shortness of breath. EXAM: CHEST - 2 VIEW COMPARISON:  None. FINDINGS: The cardiomediastinal silhouette is unremarkable. Surgical changes and scarring overlying the UPPER LEFT lung noted. There is no evidence of focal airspace disease, pulmonary edema, suspicious pulmonary nodule/mass, pleural effusion, or pneumothorax. No acute bony abnormalities are  identified. IMPRESSION: No active cardiopulmonary disease. Electronically Signed   By: Margarette Canada M.D.   On: 02/06/2020 16:17   MR 3D Recon At Scanner  Result Date: 02/07/2020 CLINICAL DATA:  History of breast cancer. Acute onset of right upper abdominal pain with nausea. EXAM: MRI ABDOMEN WITHOUT AND WITH CONTRAST (INCLUDING MRCP) TECHNIQUE: Multiplanar multisequence MR imaging of the abdomen was performed both before and after the administration of intravenous contrast. Heavily T2-weighted images of the biliary and pancreatic ducts were obtained, and three-dimensional MRCP images were rendered by post processing. CONTRAST:  7.18mL GADAVIST GADOBUTROL 1 MMOL/ML IV SOLN COMPARISON:  None. FINDINGS: Lower chest: No acute findings. Hepatobiliary: No focal liver lesions identified. There are multiple small stones identified layering within the gallbladder measuring up to 4 mm. Gallbladder wall is upper limits of normal in thickness measuring 3 mm. No surrounding inflammatory changes. Mild intrahepatic biliary dilatation. Fusiform dilatation of the common bile duct is identified which measures up to 1.2 cm, image 11/4. There may be a single tiny stone identified within the distal common bile duct measuring approximately 3 mm, image 11/14. No additional signs of choledocholithiasis or obstructing mass identified. Pancreas: The main duct is upper limits of normal caliber measuring 3 mm, image 41/2. No pancreatic inflammation or mass identified. Spleen:  Within normal limits in size and appearance. Adrenals/Urinary Tract: No masses identified. No evidence of hydronephrosis. Stomach/Bowel: Small to the moderate size hiatal hernia noted. The visualized bowel loops within the upper abdomen are unremarkable. Vascular/Lymphatic: No pathologically enlarged lymph nodes identified. No abdominal aortic aneurysm demonstrated. Other:  No free fluid or fluid collections Musculoskeletal: No suspicious bone lesions identified.  IMPRESSION: 1. Gallstones. Gallbladder wall is upper limits of normal in thickness. No surrounding inflammatory changes to suggest acute cholecystitis. 2. Fusiform dilatation of the common bile duct which measures up to 1.2 cm. Mild intrahepatic biliary dilatation is also noted. Suspect a single tiny stone identified with the distal common bile duct measuring approximately 3 mm. No additional signs of choledocholithiasis or obstructing mass identified. 3. Small to moderate size hiatal hernia. Electronically Signed   By: Kerby Moors M.D.   On: 02/07/2020 06:40   MR ABDOMEN MRCP W WO CONTAST  Result Date: 02/07/2020 CLINICAL DATA:  History of breast cancer. Acute onset of right upper abdominal pain with nausea. EXAM: MRI ABDOMEN WITHOUT AND WITH CONTRAST (INCLUDING MRCP) TECHNIQUE: Multiplanar multisequence MR imaging of the abdomen was performed both before and after the administration of intravenous contrast. Heavily T2-weighted images of the biliary and pancreatic ducts were obtained,  and three-dimensional MRCP images were rendered by post processing. CONTRAST:  7.103mL GADAVIST GADOBUTROL 1 MMOL/ML IV SOLN COMPARISON:  None. FINDINGS: Lower chest: No acute findings. Hepatobiliary: No focal liver lesions identified. There are multiple small stones identified layering within the gallbladder measuring up to 4 mm. Gallbladder wall is upper limits of normal in thickness measuring 3 mm. No surrounding inflammatory changes. Mild intrahepatic biliary dilatation. Fusiform dilatation of the common bile duct is identified which measures up to 1.2 cm, image 11/4. There may be a single tiny stone identified within the distal common bile duct measuring approximately 3 mm, image 11/14. No additional signs of choledocholithiasis or obstructing mass identified. Pancreas: The main duct is upper limits of normal caliber measuring 3 mm, image 41/2. No pancreatic inflammation or mass identified. Spleen:  Within normal limits in  size and appearance. Adrenals/Urinary Tract: No masses identified. No evidence of hydronephrosis. Stomach/Bowel: Small to the moderate size hiatal hernia noted. The visualized bowel loops within the upper abdomen are unremarkable. Vascular/Lymphatic: No pathologically enlarged lymph nodes identified. No abdominal aortic aneurysm demonstrated. Other:  No free fluid or fluid collections Musculoskeletal: No suspicious bone lesions identified. IMPRESSION: 1. Gallstones. Gallbladder wall is upper limits of normal in thickness. No surrounding inflammatory changes to suggest acute cholecystitis. 2. Fusiform dilatation of the common bile duct which measures up to 1.2 cm. Mild intrahepatic biliary dilatation is also noted. Suspect a single tiny stone identified with the distal common bile duct measuring approximately 3 mm. No additional signs of choledocholithiasis or obstructing mass identified. 3. Small to moderate size hiatal hernia. Electronically Signed   By: Kerby Moors M.D.   On: 02/07/2020 06:40   US ABDOMEN LIMITED RUQ  Result Date: 02/07/2020 CLINICAL DATA:  69 year old female with right upper quadrant abdominal pain. EXAM: ULTRASOUND ABDOMEN LIMITED RIGHT UPPER QUADRANT COMPARISON:  None. FINDINGS: Gallbladder: There multiple stones within the gallbladder. No gallbladder wall thickening or pericholecystic fluid. Negative sonographic Murphy's sign. Several echogenic foci with comet tail artifact in the region of the gallbladder fundus consistent with adenomyomatosis. Common bile duct: Diameter: 11 mm. The common bile duct is distended. No stone identified in the visualized CBD. Liver: The liver is grossly unremarkable. Portal vein is patent on color Doppler imaging with normal direction of blood flow towards the liver. Other: None. IMPRESSION: 1. Cholelithiasis without sonographic evidence of acute cholecystitis. 2. Dilated common bile duct. No stone identified in the visualized CBD. A centrally obstructing  calculus is not excluded. Electronically Signed   By: Anner Crete M.D.   On: 02/07/2020 01:32      Impression / Plan:   Brittney Harris is a 69 y.o. y/o female with choledocholithiasis and gallstones  ERCP indicated due to the above No evidence of cholangitis Discussed with Dr. Allen Norris and scheduled for tomorrow  Patient agreeable with the above plan Clear liquid diet okay today, and then n.p.o. past midnight  Would recommend surgery consult to evaluate for cholecystectomy to prevent future episodes of choledocholithiasis  Thank you for involving me in the care of this patient.      LOS: 0 days   [name], MD  02/07/2020, 12:13 PM

## 2020-02-08 ENCOUNTER — Other Ambulatory Visit: Payer: Self-pay

## 2020-02-08 ENCOUNTER — Inpatient Hospital Stay: Payer: Medicare Other | Admitting: Anesthesiology

## 2020-02-08 ENCOUNTER — Encounter
Admission: EM | Disposition: A | Discharge status: Home / Self Care | Payer: Self-pay | Source: Home / Self Care | Attending: Hospitalist

## 2020-02-08 ENCOUNTER — Encounter: Payer: Self-pay | Admitting: Hospitalist

## 2020-02-08 ENCOUNTER — Inpatient Hospital Stay: Payer: Medicare Other

## 2020-02-08 DIAGNOSIS — R748 Abnormal levels of other serum enzymes: Secondary | ICD-10-CM

## 2020-02-08 DIAGNOSIS — K8050 Calculus of bile duct without cholangitis or cholecystitis without obstruction: Secondary | ICD-10-CM

## 2020-02-08 HISTORY — PX: ERCP: SHX5425

## 2020-02-08 LAB — COMPREHENSIVE METABOLIC PANEL
ALT: 115 U/L — ABNORMAL HIGH (ref 0–44)
AST: 156 U/L — ABNORMAL HIGH (ref 15–41)
Albumin: 3.2 g/dL — ABNORMAL LOW (ref 3.5–5.0)
Alkaline Phosphatase: 147 U/L — ABNORMAL HIGH (ref 38–126)
Anion gap: 11 (ref 5–15)
BUN: 10 mg/dL (ref 8–23)
CO2: 21 mmol/L — ABNORMAL LOW (ref 22–32)
Calcium: 7.7 mg/dL — ABNORMAL LOW (ref 8.9–10.3)
Chloride: 109 mmol/L (ref 98–111)
Creatinine, Ser: 0.83 mg/dL (ref 0.44–1.00)
GFR calc Af Amer: >60 mL/min (ref >60)
GFR calc non Af Amer: >60 mL/min (ref >60)
Glucose, Bld: 92 mg/dL (ref 70–99)
Potassium: 3.5 mmol/L (ref 3.5–5.1)
Sodium: 141 mmol/L (ref 135–145)
Total Bilirubin: 1.9 mg/dL — ABNORMAL HIGH (ref 0.3–1.2)
Total Protein: 6.1 g/dL — ABNORMAL LOW (ref 6.5–8.1)

## 2020-02-08 LAB — CBC
HCT: 28.9 % — ABNORMAL LOW (ref 36.0–46.0)
Hemoglobin: 9.4 g/dL — ABNORMAL LOW (ref 12.0–15.0)
MCH: 29.7 pg (ref 26.0–34.0)
MCHC: 32.5 g/dL (ref 30.0–36.0)
MCV: 91.2 fL (ref 80.0–100.0)
Platelets: 172 10*3/uL (ref 150–400)
RBC: 3.17 MIL/uL — ABNORMAL LOW (ref 3.87–5.11)
RDW: 13.7 % (ref 11.5–15.5)
WBC: 4.4 10*3/uL (ref 4.0–10.5)
nRBC: 0 % (ref 0.0–0.2)

## 2020-02-08 LAB — MAGNESIUM: Magnesium: 1.8 mg/dL (ref 1.7–2.4)

## 2020-02-08 SURGERY — ERCP, WITH INTERVENTION IF INDICATED
Anesthesia: General

## 2020-02-08 MED ORDER — DEXAMETHASONE SODIUM PHOSPHATE 10 MG/ML IJ SOLN
INTRAMUSCULAR | Status: DC | PRN
  Administered 2020-02-08: 10 mg via INTRAVENOUS

## 2020-02-08 MED ORDER — LIDOCAINE HCL (PF) 2 % IJ SOLN
INTRAMUSCULAR | Status: AC
  Filled 2020-02-08: qty 5

## 2020-02-08 MED ORDER — ONDANSETRON HCL 4 MG/2ML IJ SOLN: 4.0000 mg | Freq: Once | INTRAMUSCULAR | Status: DC | PRN

## 2020-02-08 MED ORDER — INDOMETHACIN 50 MG RE SUPP
RECTAL | Status: DC | PRN
  Administered 2020-02-08: 100 mg via RECTAL

## 2020-02-08 MED ORDER — INDOCYANINE GREEN 25 MG IV SOLR
1.2500 mg | Freq: Once | INTRAVENOUS | Status: AC
  Administered 2020-02-09: 1.25 mg via INTRAVENOUS
  Filled 2020-02-08: qty 0.5

## 2020-02-08 MED ORDER — FENTANYL CITRATE (PF) 100 MCG/2ML IJ SOLN
INTRAMUSCULAR | Status: AC
  Filled 2020-02-08: qty 2

## 2020-02-08 MED ORDER — PROPOFOL 10 MG/ML IV BOLUS
INTRAVENOUS | Status: AC
  Filled 2020-02-08: qty 20

## 2020-02-08 MED ORDER — LIDOCAINE HCL 4 % MT SOLN
OROMUCOSAL | Status: DC | PRN
  Administered 2020-02-08: 4 mL via TOPICAL

## 2020-02-08 MED ORDER — LACTATED RINGERS IV SOLN: Freq: Once | INTRAVENOUS | Status: AC

## 2020-02-08 MED ORDER — ONDANSETRON HCL 4 MG/2ML IJ SOLN
INTRAMUSCULAR | Status: DC | PRN
  Administered 2020-02-08: 4 mg via INTRAVENOUS

## 2020-02-08 MED ORDER — LIDOCAINE HCL (CARDIAC) PF 100 MG/5ML IV SOSY
PREFILLED_SYRINGE | INTRAVENOUS | Status: DC | PRN
  Administered 2020-02-08: 50 mg via INTRAVENOUS

## 2020-02-08 MED ORDER — FENTANYL CITRATE (PF) 100 MCG/2ML IJ SOLN: 25.0000 ug | INTRAMUSCULAR | Status: DC | PRN

## 2020-02-08 MED ORDER — LEVOTHYROXINE SODIUM 100 MCG PO TABS
100.0000 ug | ORAL_TABLET | Freq: Every day | ORAL | Status: DC
  Administered 2020-02-08 – 2020-02-10 (×2): 100 ug via ORAL
  Filled 2020-02-08: qty 1
  Filled 2020-02-08: qty 2

## 2020-02-08 MED ORDER — SODIUM CHLORIDE 0.9 % IV SOLN: INTRAVENOUS | Status: DC

## 2020-02-08 MED ORDER — SUCCINYLCHOLINE CHLORIDE 200 MG/10ML IV SOSY
PREFILLED_SYRINGE | INTRAVENOUS | Status: AC
  Filled 2020-02-08: qty 10

## 2020-02-08 MED ORDER — DEXAMETHASONE SODIUM PHOSPHATE 10 MG/ML IJ SOLN
INTRAMUSCULAR | Status: AC
  Filled 2020-02-08: qty 1

## 2020-02-08 MED ORDER — PROPOFOL 10 MG/ML IV BOLUS
INTRAVENOUS | Status: DC | PRN
  Administered 2020-02-08: 160 mg via INTRAVENOUS

## 2020-02-08 MED ORDER — FENTANYL CITRATE (PF) 250 MCG/5ML IJ SOLN
INTRAMUSCULAR | Status: DC | PRN
Start: 2020-02-08 — End: 2020-02-08
  Administered 2020-02-08: 50 ug via INTRAVENOUS

## 2020-02-08 MED ORDER — ONDANSETRON HCL 4 MG/2ML IJ SOLN
INTRAMUSCULAR | Status: AC
  Filled 2020-02-08: qty 2

## 2020-02-08 MED ORDER — INDOMETHACIN 50 MG RE SUPP
100.0000 mg | Freq: Once | RECTAL | Status: DC
  Filled 2020-02-08: qty 2

## 2020-02-08 MED ORDER — SUCCINYLCHOLINE CHLORIDE 20 MG/ML IJ SOLN
INTRAMUSCULAR | Status: DC | PRN
  Administered 2020-02-08: 120 mg via INTRAVENOUS

## 2020-02-08 NOTE — ED Notes (Signed)
Family and pt updated on plan for procedure and time

## 2020-02-08 NOTE — Transfer of Care (Signed)
Immediate Anesthesia Transfer of Care Note  Patient: Brittney Harris  Procedure(s) Performed: ENDOSCOPIC RETROGRADE CHOLANGIOPANCREATOGRAPHY (ERCP) (N/A )  Patient Location: PACU  Anesthesia Type:General  Level of Consciousness: drowsy  Airway & Oxygen Therapy: Patient Spontanous Breathing and Patient connected to face mask oxygen  Post-op Assessment: Report given to RN and Post -op Vital signs reviewed and stable  Post vital signs: Reviewed and stable  Last Vitals:  Vitals Value Taken Time  BP 178/88 02/08/20 1203  Temp 36.7 C 02/08/20 1203  Pulse 79 02/08/20 1205  Resp 14 02/08/20 1205  SpO2 100 % 02/08/20 1205  Vitals shown include unvalidated device data.  Last Pain:  Vitals:   02/08/20 1203  TempSrc:   PainSc: Asleep         Complications: No complications documented.

## 2020-02-08 NOTE — Anesthesia Preprocedure Evaluation (Signed)
Anesthesia Evaluation  Patient identified by MRN, date of birth, ID band Patient awake    Reviewed: Allergy & Precautions, H&P , NPO status , Patient's Chart, lab work & pertinent test results, reviewed documented beta blocker date and time   History of Anesthesia Complications Negative for: history of anesthetic complications  Airway Mallampati: I  TM Distance: >3 FB Neck ROM: full    Dental  (+) Caps, Teeth Intact, Dental Advidsory Given   Pulmonary neg shortness of breath, neg sleep apnea, neg COPD, Recent URI , Resolved,    Pulmonary exam normal breath sounds clear to auscultation       Cardiovascular Exercise Tolerance: Good negative cardio ROS Normal cardiovascular exam Rhythm:regular Rate:Normal     Neuro/Psych negative neurological ROS  negative psych ROS   GI/Hepatic negative GI ROS, Neg liver ROS,   Endo/Other  neg diabetesHypothyroidism   Renal/GU negative Renal ROS  negative genitourinary   Musculoskeletal   Abdominal   Peds  Hematology negative hematology ROS (+)   Anesthesia Other Findings Past Medical History: No date: Cancer ([place])   Reproductive/Obstetrics negative OB ROS                             Anesthesia Physical Anesthesia Plan  ASA: II  Anesthesia Plan: General   Post-op Pain Management:    Induction: Intravenous  PONV Risk Score and Plan: 3 and Ondansetron, Dexamethasone and Treatment may vary due to age or medical condition  Airway Management Planned: Oral ETT  Additional Equipment:   Intra-op Plan:   Post-operative Plan: Extubation in OR  Informed Consent: I have reviewed the patients History and Physical, chart, labs and discussed the procedure including the risks, benefits and alternatives for the proposed anesthesia with the patient or authorized representative who has indicated his/her understanding and acceptance.     Dental Advisory  Given  Plan Discussed with: Anesthesiologist, CRNA and Surgeon  Anesthesia Plan Comments:         Anesthesia Quick Evaluation

## 2020-02-08 NOTE — Consult Note (Signed)
SURGICAL CONSULTATION NOTE   HISTORY OF PRESENT ILLNESS (HPI):  69 y.o. female presented to [facility] ED for evaluation of abdominal pain since 2 days ago.  She reports that she was eating out back on Sunday.  After eating she had a severe abdominal pain in the upper abdomen.  She reports sweating.  Pain radiated to her back.  There was no alleviating or rating factor.    Patient came to the ED for evaluation.  She was found with ultrasound showing stones she had her bladder and dilated common bile duct.  Her bilirubin was also elevated.  She had MRCP that shows choledocholithiasis.  She was evaluated by GI and she had ERCP today that confirmed the choledocholithiasis.  He was able to be resolved with ERCP.  At the moment of my evaluation after the ERCP the patient has no abdominal pain.  She is tolerating clear liquid diet.  Surgery is consulted by Dr. Billie Ruddy in this context for evaluation and management of choledocholithiasis.  PAST MEDICAL HISTORY (PMH):  Past Medical History:  Diagnosis Date  . Cancer (HCC)    BREAST  . Hypertension      PAST SURGICAL HISTORY ([place]):  Past Surgical History:  Procedure Laterality Date  . MASTECTOMY Left      MEDICATIONS:  Prior to Admission medications   Medication Sig Start Date End Date Taking? Authorizing Provider  furosemide (LASIX) 20 MG tablet Take 10 mg by mouth at bedtime as needed for fluid.   Yes [provider]  levothyroxine (SYNTHROID) 100 MCG tablet Take 100 mcg by mouth daily. 02/06/20  Yes [provider]     ALLERGIES:  Allergies  Allergen Reactions  . Oxycodone-Acetaminophen Nausea And Vomiting     SOCIAL HISTORY:  Social History   Socioeconomic History  . Marital status: Married    Spouse name: Not on file  . Number of children: Not on file  . Years of education: Not on file  . Highest education level: Not on file  Occupational History  . Not on file  Tobacco Use  . Smoking status: Never Smoker  .  Smokeless tobacco: Never Used  Vaping Use  . Vaping Use: Never used  Substance and Sexual Activity  . Alcohol use: Never  . Drug use: Never  . Sexual activity: Not on file  Other Topics Concern  . Not on file  Social History Narrative  . Not on file   Social Determinants of Health   Financial Resource Strain:   . Difficulty of Paying Living Expenses: Not on file  Food Insecurity:   . Worried About Charity fundraiser in the Last Year: Not on file  . Ran Out of Food in the Last Year: Not on file  Transportation Needs:   . Lack of Transportation (Medical): Not on file  . Lack of Transportation (Non-Medical): Not on file  Physical Activity:   . Days of Exercise per Week: Not on file  . Minutes of Exercise per Session: Not on file  Stress:   . Feeling of Stress : Not on file  Social Connections:   . Frequency of Communication with Friends and Family: Not on file  . Frequency of Social Gatherings with Friends and Family: Not on file  . Attends Religious Services: Not on file  . Active Member of Clubs or Organizations: Not on file  . Attends Archivist Meetings: Not on file  . Marital Status: Not on file  Intimate Partner Violence:   .  Fear of Current or Ex-Partner: Not on file  . Emotionally Abused: Not on file  . Physically Abused: Not on file  . Sexually Abused: Not on file     FAMILY HISTORY:  History reviewed. No pertinent family history.   REVIEW OF SYSTEMS:  Constitutional: denies weight loss, fever, chills, or sweats  Eyes: denies any other vision changes, history of eye injury  ENT: denies sore throat, hearing problems  Respiratory: denies shortness of breath, wheezing  Cardiovascular: denies chest pain, palpitations  Gastrointestinal: positive abdominal pain, nausea and vomitnig Genitourinary: denies burning with urination or urinary frequency Musculoskeletal: denies any other joint pains or cramps  Skin: denies any other rashes or skin  discolorations  Neurological: denies any other headache, dizziness, weakness  Psychiatric: denies any other depression, anxiety   All other review of systems were negative   VITAL SIGNS:  Temp:  [97.2 F (36.2 C)-98.2 F (36.8 C)] 98.2 F (36.8 C) (08/24 1252) Pulse Rate:  [71-81] 71 (08/24 1256) Resp:  [11-19] 16 (08/24 1252) BP: (128-191)/(69-105) 155/103 (08/24 1256) SpO2:  [95 %-100 %] 95 % (08/24 1252) Weight:  [90.7 kg] 90.7 kg (08/24 1053)     Height: 5\' 5"  (165.1 cm) Weight: 90.7 kg BMI (Calculated): 33.28   INTAKE/OUTPUT:  This shift: Total I/O In: 450 [I.V.:450] Out: -   Last 2 shifts: @IOLAST2SHIFTS @   PHYSICAL EXAM:  Constitutional:  -- Normal body habitus  -- Awake, alert, and oriented x3  Eyes:  -- Pupils equally round and reactive to light  -- No scleral icterus  Ear, nose, and throat:  -- No jugular venous distension  Pulmonary:  -- No crackles  -- Equal breath sounds bilaterally -- Breathing non-labored at rest Cardiovascular:  -- S1, S2 present  -- No pericardial rubs Gastrointestinal:  -- Abdomen soft, nontender, non-distended, no guarding or rebound tenderness -- No abdominal masses appreciated, pulsatile or otherwise  Musculoskeletal and Integumentary:  -- Wounds: None appreciated -- Extremities: B/L UE and LE FROM, hands and feet warm, no edema  Neurologic:  -- Motor function: intact and symmetric -- Sensation: intact and symmetric   Labs:  CBC Latest Ref Rng & Units 02/08/2020 02/07/2020 02/06/2020  WBC 4.0 - 10.5 K/uL 4.4 5.6 6.4  Hemoglobin 12.0 - 15.0 g/dL 9.4(L) 10.4(L) 11.5(L)  Hematocrit 36 - 46 % 28.9(L) 31.0(L) 34.1(L)  Platelets 150 - 400 K/uL 172 211 277   CMP Latest Ref Rng & Units 02/08/2020 02/07/2020 02/06/2020  Glucose 70 - 99 mg/dL 92 127(H) 132(H)  BUN 8 - 23 mg/dL 10 19 20   Creatinine 0.44 - 1.00 mg/dL 0.83 0.93 1.04(H)  Sodium 135 - 145 mmol/L 141 138 139  Potassium 3.5 - 5.1 mmol/L 3.5 3.9 2.9(L)  Chloride 98 - 111  mmol/L 109 103 104  CO2 22 - 32 mmol/L 21(L) 23 24  Calcium 8.9 - 10.3 mg/dL 7.7(L) 8.8(L) 8.9  Total Protein 6.5 - 8.1 g/dL 6.1(L) 7.9 7.9  Total Bilirubin 0.3 - 1.2 mg/dL 1.9(H) 2.3(H) 1.4(H)  Alkaline Phos 38 - 126 U/L 147(H) 157(H) 121  AST 15 - 41 U/L 156(H) 275(H) 176(H)  ALT 0 - 44 U/L 115(H) 123(H) 55(H)    Imaging studies:  I personally evaluated the images of the ultrasound that shows stone in the bladder without debris or thickening.  I also evaluated the MRCP images that confirmed the choledocholithiasis.  I also evaluated the ERCP images and report.  Assessment/Plan:  69 y.o. female with choledocholithiasis, complicated by pertinent comorbidities  including hypertension.  Patient with history, physical exam and images consistent with biliary colic due to choledocholithiasis.  Patient had successful ERCP done today.  At the moment of my evaluation patient with abdominal pain no sign of pancreatitis.  Patient oriented about diagnosis and surgical management as treatment.  If there is no abdominal pain tomorrow and no sign of pancreatitis will proceed with surgical management with cholecystectomy.  Discussed the risk of surgery including post-op infxn, seroma, biloma, chronic pain, poor-delayed wound healing, retained gallstone, conversion to open procedure, post-op SBO or ileus, and need for additional procedures to address said risks.  The risks of general anesthetic including MI, CVA, sudden death or even reaction to anesthetic medications also discussed. Alternatives include continued observation.  Benefits include possible symptom relief, prevention of complications including acute cholecystitis, pancreatitis.  Arnold Long, MD

## 2020-02-08 NOTE — ED Notes (Signed)
Pt ambulated to restroom w steady gait. Brushed teeth. Pillow placed under R arm for comfort.

## 2020-02-08 NOTE — ED Notes (Signed)
Pt going to Endo at this time.

## 2020-02-08 NOTE — Progress Notes (Addendum)
PROGRESS NOTE    Brittney Harris  MEQ:[id number] DOB: 1951-03-20 DOA: 02/07/2020 PCP: System, Provider Not In    Assessment & Plan:   Active Problems:   Choledocholithiasis   Hypertension   Hypothyroidism    Brittney Harris is a 69 y.o. Caucasian female with medical history significant for HTN, hypothyroidism and history of left breast cancer status post chemoradiation and mastectomy with reconstruction in 2012 who presents to the emergency room with sudden onset mid and right upper quadrant pain of moderate to severe intensity, nonradiating, crampy in nature that started after eating at a restaurant.  It was associated with 3 episodes of vomiting of gastric contents.  No associated diarrhea.  No fever or chills.    Acute biliary colic secondary to choledocholithiasis # Cholelithiasis -Patient presents with sudden onset RUQ pain and vomiting following a meal at a restaurant with elevated LFTs on work-up and dilated CBD on ultrasound.  Cholelithiasis without sonographic evidence of acute cholecystitis. --zosyn started on presentation -MRCP showed 3 mm stone in the common bile duct PLAN: --ERCP today with removal of 1 stone --continue zosyn for now --d/c MIVF --resume clear liquid diet --surgery consult today, plan for cholecystectomy tomorrow  Hypokalemia -monitor and replete PRN    Hypertension -Not on regular BP medication at home.  BP varied widely. --Hold BP for now --IV hydralazine PRN    Hypothyroidism --continue home Synthroid   DVT prophylaxis: SCD/Compression stockings Code Status: Full code  Family Communication:  Status is: inpatient Dispo:   The patient is from: home Anticipated d/c is to: home Anticipated d/c date is: 2-3 days Patient currently is not medically stable to d/c due to: pending cholecystectomy   Subjective and Interval History:  Had ERCP today with removal of 1 stone from common bile duct.  After procedure, pt denied pain, but reported  some nausea.   GenSurg consulted today for consideration of cholecystectomy.    Objective: Vitals:   02/08/20 1233 02/08/20 1252 02/08/20 1255 02/08/20 1256  BP: (!) 150/77  (!) 191/105 (!) 155/103  Pulse: 78 80 73 71  Resp: 18 16    Temp: (!) 97.2 F (36.2 C) 98.2 F (36.8 C)    TempSrc:      SpO2: 100% 95%    Weight:      Height:        Intake/Output Summary (Last 24 hours) at 02/08/2020 1457 Last data filed at 02/08/2020 1155 Gross per 24 hour  Intake 450 ml  Output --  Net 450 ml   Filed Weights   02/06/20 1529 02/08/20 0715 02/08/20 1053  Weight: 90.7 kg 90.7 kg 90.7 kg    Examination:   Constitutional: NAD, AAOx3 HEENT: conjunctivae and lids normal, EOMI CV: RRR no M,R,G. Distal pulses +2.  No cyanosis.   RESP: CTA B/L, normal respiratory effort  GI: +BS, NTND Extremities: No effusions, edema, or tenderness in BLE SKIN: warm, dry and intact Neuro: II - XII grossly intact.  Sensation intact Psych: Normal mood and affect.  Appropriate judgement and reason    Data Reviewed: I have personally reviewed following labs and imaging studies  CBC: Recent Labs  Lab 02/06/20 1535 02/07/20 1111 02/08/20 0257  WBC 6.4 5.6 4.4  HGB 11.5* 10.4* 9.4*  HCT 34.1* 31.0* 28.9*  MCV 86.3 87.1 91.2  PLT 277 211 297   Basic Metabolic Panel: Recent Labs  Lab 02/06/20 1535 02/07/20 0326 02/07/20 1111 02/08/20 0257  NA 139 138  --  141  K 2.9* 3.9  --  3.5  CL 104 103  --  109  CO2 24 23  --  21*  GLUCOSE 132* 127*  --  92  BUN 20 19  --  10  CREATININE 1.04* 0.93  --  0.83  CALCIUM 8.9 8.8*  --  7.7*  MG  --   --  1.8 1.8   GFR: Estimated Creatinine Clearance: 70.2 mL/min (by C-G formula based on SCr of 0.83 mg/dL). Liver Function Tests: Recent Labs  Lab 02/06/20 1809 02/07/20 0326 02/08/20 0257  AST 176* 275* 156*  ALT 55* 123* 115*  ALKPHOS 121 157* 147*  BILITOT 1.4* 2.3* 1.9*  PROT 7.9 7.9 6.1*  ALBUMIN 4.1 4.1 3.2*   No results for input(s):  LIPASE, AMYLASE in the last 168 hours. No results for input(s): AMMONIA in the last 168 hours. Coagulation Profile: No results for input(s): INR, PROTIME in the last 168 hours. Cardiac Enzymes: No results for input(s): CKTOTAL, CKMB, CKMBINDEX, TROPONINI in the last 168 hours. BNP (last 3 results) No results for input(s): PROBNP in the last 8760 hours. HbA1C: No results for input(s): HGBA1C in the last 72 hours. CBG: No results for input(s): GLUCAP in the last 168 hours. Lipid Profile: No results for input(s): CHOL, HDL, LDLCALC, TRIG, CHOLHDL, LDLDIRECT in the last 72 hours. Thyroid Function Tests: No results for input(s): TSH, T4TOTAL, FREET4, T3FREE, THYROIDAB in the last 72 hours. Anemia Panel: No results for input(s): VITAMINB12, FOLATE, FERRITIN, TIBC, IRON, RETICCTPCT in the last 72 hours. Sepsis Labs: No results for input(s): PROCALCITON, LATICACIDVEN in the last 168 hours.  Recent Results (from the past 240 hour(s))  SARS Coronavirus 2 by RT PCR (hospital order, performed in [facility] hospital lab) Nasopharyngeal Nasopharyngeal Swab     Status: None   Collection Time: 02/07/20  3:26 AM   Specimen: Nasopharyngeal Swab  Result Value Ref Range Status   SARS Coronavirus 2 NEGATIVE NEGATIVE Final    Comment: (NOTE) SARS-CoV-2 target nucleic acids are NOT DETECTED.  The SARS-CoV-2 RNA is generally detectable in upper and lower respiratory specimens during the acute phase of infection. The lowest concentration of SARS-CoV-2 viral copies this assay can detect is 250 copies / mL. A negative result does not preclude SARS-CoV-2 infection and should not be used as the sole basis for treatment or other patient management decisions.  A negative result may occur with improper specimen collection / handling, submission of specimen other than nasopharyngeal swab, presence of viral mutation(s) within the areas targeted by this assay, and inadequate number of viral copies (<250 copies  / mL). A negative result must be combined with clinical observations, patient history, and epidemiological information.  Fact Sheet for Patients:   StrictlyIdeas.no  Fact Sheet for Healthcare Providers: BankingDealers.co.za  This test is not yet approved or  cleared by the [place] FDA and has been authorized for detection and/or diagnosis of SARS-CoV-2 by FDA under an Emergency Use Authorization (EUA).  This EUA will remain in effect (meaning this test can be used) for the duration of the COVID-19 declaration under Section 564(b)(1) of the Act, 21 U.S.C. section 360bbb-3(b)(1), unless the authorization is terminated or revoked sooner.  Performed at [facility], [address] [place], [place] [address]       Radiology Studies: DG Chest 2 View  Result Date: 02/06/2020 CLINICAL DATA:  Acute shortness of breath. EXAM: CHEST - 2 VIEW COMPARISON:  None. FINDINGS: The cardiomediastinal silhouette is unremarkable. Surgical changes  and scarring overlying the UPPER LEFT lung noted. There is no evidence of focal airspace disease, pulmonary edema, suspicious pulmonary nodule/mass, pleural effusion, or pneumothorax. No acute bony abnormalities are identified. IMPRESSION: No active cardiopulmonary disease. Electronically Signed   By: Margarette Canada M.D.   On: 02/06/2020 16:17   MR 3D Recon At Scanner  Result Date: 02/07/2020 CLINICAL DATA:  History of breast cancer. Acute onset of right upper abdominal pain with nausea. EXAM: MRI ABDOMEN WITHOUT AND WITH CONTRAST (INCLUDING MRCP) TECHNIQUE: Multiplanar multisequence MR imaging of the abdomen was performed both before and after the administration of intravenous contrast. Heavily T2-weighted images of the biliary and pancreatic ducts were obtained, and three-dimensional MRCP images were rendered by post processing. CONTRAST:  7.66mL GADAVIST GADOBUTROL 1 MMOL/ML IV SOLN COMPARISON:  None.  FINDINGS: Lower chest: No acute findings. Hepatobiliary: No focal liver lesions identified. There are multiple small stones identified layering within the gallbladder measuring up to 4 mm. Gallbladder wall is upper limits of normal in thickness measuring 3 mm. No surrounding inflammatory changes. Mild intrahepatic biliary dilatation. Fusiform dilatation of the common bile duct is identified which measures up to 1.2 cm, image 11/4. There may be a single tiny stone identified within the distal common bile duct measuring approximately 3 mm, image 11/14. No additional signs of choledocholithiasis or obstructing mass identified. Pancreas: The main duct is upper limits of normal caliber measuring 3 mm, image 41/2. No pancreatic inflammation or mass identified. Spleen:  Within normal limits in size and appearance. Adrenals/Urinary Tract: No masses identified. No evidence of hydronephrosis. Stomach/Bowel: Small to the moderate size hiatal hernia noted. The visualized bowel loops within the upper abdomen are unremarkable. Vascular/Lymphatic: No pathologically enlarged lymph nodes identified. No abdominal aortic aneurysm demonstrated. Other:  No free fluid or fluid collections Musculoskeletal: No suspicious bone lesions identified. IMPRESSION: 1. Gallstones. Gallbladder wall is upper limits of normal in thickness. No surrounding inflammatory changes to suggest acute cholecystitis. 2. Fusiform dilatation of the common bile duct which measures up to 1.2 cm. Mild intrahepatic biliary dilatation is also noted. Suspect a single tiny stone identified with the distal common bile duct measuring approximately 3 mm. No additional signs of choledocholithiasis or obstructing mass identified. 3. Small to moderate size hiatal hernia. Electronically Signed   By: Kerby Moors M.D.   On: 02/07/2020 06:40   DG C-Arm 1-60 Min-No Report  Result Date: 02/08/2020 Fluoroscopy was utilized by the requesting physician.  No radiographic  interpretation.   MR ABDOMEN MRCP W WO CONTAST  Result Date: 02/07/2020 CLINICAL DATA:  History of breast cancer. Acute onset of right upper abdominal pain with nausea. EXAM: MRI ABDOMEN WITHOUT AND WITH CONTRAST (INCLUDING MRCP) TECHNIQUE: Multiplanar multisequence MR imaging of the abdomen was performed both before and after the administration of intravenous contrast. Heavily T2-weighted images of the biliary and pancreatic ducts were obtained, and three-dimensional MRCP images were rendered by post processing. CONTRAST:  7.58mL GADAVIST GADOBUTROL 1 MMOL/ML IV SOLN COMPARISON:  None. FINDINGS: Lower chest: No acute findings. Hepatobiliary: No focal liver lesions identified. There are multiple small stones identified layering within the gallbladder measuring up to 4 mm. Gallbladder wall is upper limits of normal in thickness measuring 3 mm. No surrounding inflammatory changes. Mild intrahepatic biliary dilatation. Fusiform dilatation of the common bile duct is identified which measures up to 1.2 cm, image 11/4. There may be a single tiny stone identified within the distal common bile duct measuring approximately 3 mm, image 11/14. No additional signs  of choledocholithiasis or obstructing mass identified. Pancreas: The main duct is upper limits of normal caliber measuring 3 mm, image 41/2. No pancreatic inflammation or mass identified. Spleen:  Within normal limits in size and appearance. Adrenals/Urinary Tract: No masses identified. No evidence of hydronephrosis. Stomach/Bowel: Small to the moderate size hiatal hernia noted. The visualized bowel loops within the upper abdomen are unremarkable. Vascular/Lymphatic: No pathologically enlarged lymph nodes identified. No abdominal aortic aneurysm demonstrated. Other:  No free fluid or fluid collections Musculoskeletal: No suspicious bone lesions identified. IMPRESSION: 1. Gallstones. Gallbladder wall is upper limits of normal in thickness. No surrounding  inflammatory changes to suggest acute cholecystitis. 2. Fusiform dilatation of the common bile duct which measures up to 1.2 cm. Mild intrahepatic biliary dilatation is also noted. Suspect a single tiny stone identified with the distal common bile duct measuring approximately 3 mm. No additional signs of choledocholithiasis or obstructing mass identified. 3. Small to moderate size hiatal hernia. Electronically Signed   By: Kerby Moors M.D.   On: 02/07/2020 06:40   US ABDOMEN LIMITED RUQ  Result Date: 02/07/2020 CLINICAL DATA:  69 year old female with right upper quadrant abdominal pain. EXAM: ULTRASOUND ABDOMEN LIMITED RIGHT UPPER QUADRANT COMPARISON:  None. FINDINGS: Gallbladder: There multiple stones within the gallbladder. No gallbladder wall thickening or pericholecystic fluid. Negative sonographic Murphy's sign. Several echogenic foci with comet tail artifact in the region of the gallbladder fundus consistent with adenomyomatosis. Common bile duct: Diameter: 11 mm. The common bile duct is distended. No stone identified in the visualized CBD. Liver: The liver is grossly unremarkable. Portal vein is patent on color Doppler imaging with normal direction of blood flow towards the liver. Other: None. IMPRESSION: 1. Cholelithiasis without sonographic evidence of acute cholecystitis. 2. Dilated common bile duct. No stone identified in the visualized CBD. A centrally obstructing calculus is not excluded. Electronically Signed   By: Anner Crete M.D.   On: 02/07/2020 01:32     Scheduled Meds: . indomethacin  100 mg Rectal Once  . levothyroxine  100 mcg Oral Q0600   Continuous Infusions: . piperacillin-tazobactam 3.375 g (02/08/20 1427)     LOS: 1 day     Enzo Bi, MD [facility] If 7PM-7AM, please contact night-coverage 02/08/2020, 2:57 PM

## 2020-02-08 NOTE — Progress Notes (Signed)
Brittney Antigua, MD [address] [place], [place], [place], [address] [address], [place], [place], [place], [address] Phone: [phone]  Fax: [phone]   Subjective:  Patient reports abdominal pain has improved but still present.  No fever or chills.  Husband at bedside.  Objective: Exam: Vital signs in last 24 hours: Vitals:   02/08/20 0600 02/08/20 0615 02/08/20 0715 02/08/20 0800  BP: (!) 143/84 (!) 143/84  137/72  Pulse:  74  73  Resp:  18  19  Temp:      TempSrc:      SpO2:  98%  97%  Weight:   90.7 kg   Height:   5\' 5"  (1.651 m)    Weight change:  No intake or output data in the 24 hours ending 02/08/20 1022  General: No acute distress, AAO x3 Abd: Soft, NT/ND, No HSM Skin: Warm, no rashes Neck: Supple, Trachea midline   Lab Results: Lab Results  Component Value Date   WBC 4.4 02/08/2020   HGB 9.4 (L) 02/08/2020   HCT 28.9 (L) 02/08/2020   MCV 91.2 02/08/2020   PLT 172 02/08/2020   Micro Results: Recent Results (from the past 240 hour(s))  SARS Coronavirus 2 by RT PCR (hospital order, performed in [place] hospital lab) Nasopharyngeal Nasopharyngeal Swab     Status: None   Collection Time: 02/07/20  3:26 AM   Specimen: Nasopharyngeal Swab  Result Value Ref Range Status   SARS Coronavirus 2 NEGATIVE NEGATIVE Final    Comment: (NOTE) SARS-CoV-2 target nucleic acids are NOT DETECTED.  The SARS-CoV-2 RNA is generally detectable in upper and lower respiratory specimens during the acute phase of infection. The lowest concentration of SARS-CoV-2 viral copies this assay can detect is 250 copies / mL. A negative result does not preclude SARS-CoV-2 infection and should not be used as the sole basis for treatment or other patient management decisions.  A negative result may occur with improper specimen collection / handling, submission of specimen other than nasopharyngeal swab, presence of viral mutation(s) within the areas targeted by  this assay, and inadequate number of viral copies (<250 copies / mL). A negative result must be combined with clinical observations, patient history, and epidemiological information.  Fact Sheet for Patients:   StrictlyIdeas.no  Fact Sheet for Healthcare Providers: BankingDealers.co.za  This test is not yet approved or  cleared by the [place] FDA and has been authorized for detection and/or diagnosis of SARS-CoV-2 by FDA under an Emergency Use Authorization (EUA).  This EUA will remain in effect (meaning this test can be used) for the duration of the COVID-19 declaration under Section 564(b)(1) of the Act, 21 U.S.C. section 360bbb-3(b)(1), unless the authorization is terminated or revoked sooner.  Performed at [facility], [address] [place], [place] [address]    Studies/Results: DG Chest 2 View  Result Date: 02/06/2020 CLINICAL DATA:  Acute shortness of breath. EXAM: CHEST - 2 VIEW COMPARISON:  None. FINDINGS: The cardiomediastinal silhouette is unremarkable. Surgical changes and scarring overlying the UPPER LEFT lung noted. There is no evidence of focal airspace disease, pulmonary edema, suspicious pulmonary nodule/mass, pleural effusion, or pneumothorax. No acute bony abnormalities are identified. IMPRESSION: No active cardiopulmonary disease. Electronically Signed   By: Margarette Canada M.D.   On: 02/06/2020 16:17   MR 3D Recon At Scanner  Result Date: 02/07/2020 CLINICAL DATA:  History of breast cancer. Acute onset of right upper abdominal pain with nausea. EXAM: MRI ABDOMEN WITHOUT AND  WITH CONTRAST (INCLUDING MRCP) TECHNIQUE: Multiplanar multisequence MR imaging of the abdomen was performed both before and after the administration of intravenous contrast. Heavily T2-weighted images of the biliary and pancreatic ducts were obtained, and three-dimensional MRCP images were rendered by post processing. CONTRAST:  7.62mL  GADAVIST GADOBUTROL 1 MMOL/ML IV SOLN COMPARISON:  None. FINDINGS: Lower chest: No acute findings. Hepatobiliary: No focal liver lesions identified. There are multiple small stones identified layering within the gallbladder measuring up to 4 mm. Gallbladder wall is upper limits of normal in thickness measuring 3 mm. No surrounding inflammatory changes. Mild intrahepatic biliary dilatation. Fusiform dilatation of the common bile duct is identified which measures up to 1.2 cm, image 11/4. There may be a single tiny stone identified within the distal common bile duct measuring approximately 3 mm, image 11/14. No additional signs of choledocholithiasis or obstructing mass identified. Pancreas: The main duct is upper limits of normal caliber measuring 3 mm, image 41/2. No pancreatic inflammation or mass identified. Spleen:  Within normal limits in size and appearance. Adrenals/Urinary Tract: No masses identified. No evidence of hydronephrosis. Stomach/Bowel: Small to the moderate size hiatal hernia noted. The visualized bowel loops within the upper abdomen are unremarkable. Vascular/Lymphatic: No pathologically enlarged lymph nodes identified. No abdominal aortic aneurysm demonstrated. Other:  No free fluid or fluid collections Musculoskeletal: No suspicious bone lesions identified. IMPRESSION: 1. Gallstones. Gallbladder wall is upper limits of normal in thickness. No surrounding inflammatory changes to suggest acute cholecystitis. 2. Fusiform dilatation of the common bile duct which measures up to 1.2 cm. Mild intrahepatic biliary dilatation is also noted. Suspect a single tiny stone identified with the distal common bile duct measuring approximately 3 mm. No additional signs of choledocholithiasis or obstructing mass identified. 3. Small to moderate size hiatal hernia. Electronically Signed   By: Kerby Moors M.D.   On: 02/07/2020 06:40   MR ABDOMEN MRCP W WO CONTAST  Result Date: 02/07/2020 CLINICAL DATA:   History of breast cancer. Acute onset of right upper abdominal pain with nausea. EXAM: MRI ABDOMEN WITHOUT AND WITH CONTRAST (INCLUDING MRCP) TECHNIQUE: Multiplanar multisequence MR imaging of the abdomen was performed both before and after the administration of intravenous contrast. Heavily T2-weighted images of the biliary and pancreatic ducts were obtained, and three-dimensional MRCP images were rendered by post processing. CONTRAST:  7.35mL GADAVIST GADOBUTROL 1 MMOL/ML IV SOLN COMPARISON:  None. FINDINGS: Lower chest: No acute findings. Hepatobiliary: No focal liver lesions identified. There are multiple small stones identified layering within the gallbladder measuring up to 4 mm. Gallbladder wall is upper limits of normal in thickness measuring 3 mm. No surrounding inflammatory changes. Mild intrahepatic biliary dilatation. Fusiform dilatation of the common bile duct is identified which measures up to 1.2 cm, image 11/4. There may be a single tiny stone identified within the distal common bile duct measuring approximately 3 mm, image 11/14. No additional signs of choledocholithiasis or obstructing mass identified. Pancreas: The main duct is upper limits of normal caliber measuring 3 mm, image 41/2. No pancreatic inflammation or mass identified. Spleen:  Within normal limits in size and appearance. Adrenals/Urinary Tract: No masses identified. No evidence of hydronephrosis. Stomach/Bowel: Small to the moderate size hiatal hernia noted. The visualized bowel loops within the upper abdomen are unremarkable. Vascular/Lymphatic: No pathologically enlarged lymph nodes identified. No abdominal aortic aneurysm demonstrated. Other:  No free fluid or fluid collections Musculoskeletal: No suspicious bone lesions identified. IMPRESSION: 1. Gallstones. Gallbladder wall is upper limits of normal in thickness. No surrounding  inflammatory changes to suggest acute cholecystitis. 2. Fusiform dilatation of the common bile duct  which measures up to 1.2 cm. Mild intrahepatic biliary dilatation is also noted. Suspect a single tiny stone identified with the distal common bile duct measuring approximately 3 mm. No additional signs of choledocholithiasis or obstructing mass identified. 3. Small to moderate size hiatal hernia. Electronically Signed   By: Kerby Moors M.D.   On: 02/07/2020 06:40   US ABDOMEN LIMITED RUQ  Result Date: 02/07/2020 CLINICAL DATA:  69 year old female with right upper quadrant abdominal pain. EXAM: ULTRASOUND ABDOMEN LIMITED RIGHT UPPER QUADRANT COMPARISON:  None. FINDINGS: Gallbladder: There multiple stones within the gallbladder. No gallbladder wall thickening or pericholecystic fluid. Negative sonographic Murphy's sign. Several echogenic foci with comet tail artifact in the region of the gallbladder fundus consistent with adenomyomatosis. Common bile duct: Diameter: 11 mm. The common bile duct is distended. No stone identified in the visualized CBD. Liver: The liver is grossly unremarkable. Portal vein is patent on color Doppler imaging with normal direction of blood flow towards the liver. Other: None. IMPRESSION: 1. Cholelithiasis without sonographic evidence of acute cholecystitis. 2. Dilated common bile duct. No stone identified in the visualized CBD. A centrally obstructing calculus is not excluded. Electronically Signed   By: Anner Crete M.D.   On: 02/07/2020 01:32   Medications:  Scheduled Meds: . indomethacin  100 mg Rectal Once  . levothyroxine  100 mcg Oral Q0600   Continuous Infusions: . sodium chloride 20 mL/hr at 02/08/20 0828  . piperacillin-tazobactam Stopped (02/08/20 0828)   PRN Meds:.acetaminophen **OR** acetaminophen, labetalol, morphine injection, ondansetron **OR** ondansetron (ZOFRAN) IV   Assessment: Active Problems:   Choledocholithiasis   Hypertension   Hypothyroidism    Plan: Surgery consult pending for discussing timeline of cholecystectomy  ERCP today  for CBD stone removal  Continue n.p.o. status until post procedure.      LOS: 1 day   Brittney Antigua, MD 02/08/2020, 10:22 AM

## 2020-02-08 NOTE — Op Note (Signed)
[facility] Gastroenterology Patient Name: Brittney Harris Procedure Date: 02/08/2020 11:09 AM MRN: [id number] Account #: [id number] Date of Birth: 1951-01-24 Admit Type: Outpatient Age: 69 Room: [facility] ENDO ROOM 4 Gender: Female Note Status: Finalized Procedure:             ERCP Indications:           Common bile duct stone(s) Providers:             Lucilla Lame MD, MD Medicines:             General Anesthesia Complications:         No immediate complications. Procedure:             Pre-Anesthesia Assessment:                        - Prior to the procedure, a History and Physical was                         performed, and patient medications and allergies were                         reviewed. The patient's tolerance of previous                         anesthesia was also reviewed. The risks and benefits                         of the procedure and the sedation options and risks                         were discussed with the patient. All questions were                         answered, and informed consent was obtained. Prior                         Anticoagulants: The patient has taken no previous                         anticoagulant or antiplatelet agents. ASA Grade                         Assessment: II - A patient with mild systemic disease.                         After reviewing the risks and benefits, the patient                         was deemed in satisfactory condition to undergo the                         procedure.                        After obtaining informed consent, the scope was passed                         under direct vision. Throughout the procedure, the  patient's blood pressure, pulse, and oxygen                         saturations were monitored continuously. The was                         introduced through the mouth, and used to inject                         contrast into and used to inject contrast into the                          bile duct. The ERCP was accomplished without                         difficulty. The patient tolerated the procedure well. Findings:      The scout film was normal. The esophagus was successfully intubated       under direct vision. The scope was advanced to a normal major papilla in       the descending duodenum without detailed examination of the pharynx,       larynx and associated structures, and upper GI tract. The upper GI tract       was grossly normal. The bile duct was deeply cannulated with the       short-nosed traction sphincterotome. Contrast was injected. I personally       interpreted the bile duct images. There was brisk flow of contrast       through the ducts. Image quality was excellent. Contrast extended to the       entire biliary tree. The lower third of the main bile duct contained one       stone. A wire was passed into the biliary tree. A 5 mm biliary       sphincterotomy was made with a traction (standard) sphincterotome using       ERBE electrocautery. There was no post-sphincterotomy bleeding. The       biliary tree was swept with a 15 mm balloon starting at the bifurcation.       One stone was removed. No stones remained. Impression:            - Choledocholithiasis was found. Complete removal was                         accomplished by biliary sphincterotomy and balloon                         extraction.                        - A biliary sphincterotomy was performed.                        - The biliary tree was swept. Recommendation:        - Return patient to hospital ward for ongoing care.                        - Continue present medications.                        - Watch for pancreatitis, bleeding,  perforation, and                         cholangitis. Procedure Code(s):     --- Professional ---                        [phone], Endoscopic retrograde cholangiopancreatography                         (ERCP); with removal of  calculi/debris from                         biliary/pancreatic duct(s)                        [id number], Endoscopic retrograde cholangiopancreatography                         (ERCP); with sphincterotomy/papillotomy                        [phone], Endoscopic catheterization of the biliary                         ductal system, radiological supervision and                         interpretation Diagnosis Code(s):     --- Professional ---                        K80.50, Calculus of bile duct without cholangitis or                         cholecystitis without obstruction CPT copyright 2019 American Medical Association. All rights reserved. The codes documented in this report are preliminary and upon coder review may  be revised to meet current compliance requirements. Lucilla Lame MD, MD 02/08/2020 11:50:36 AM This report has been signed electronically. Number of Addenda: 0 Note Initiated On: 02/08/2020 11:09 AM Estimated Blood Loss:  Estimated blood loss: none.      [facility]

## 2020-02-08 NOTE — H&P (View-Only) (Signed)
SURGICAL CONSULTATION NOTE   HISTORY OF PRESENT ILLNESS (HPI):  69 y.o. female presented to [facility] ED for evaluation of abdominal pain since 2 days ago.  She reports that she was eating out back on Sunday.  After eating she had a severe abdominal pain in the upper abdomen.  She reports sweating.  Pain radiated to her back.  There was no alleviating or rating factor.    Patient came to the ED for evaluation.  She was found with ultrasound showing stones she had her bladder and dilated common bile duct.  Her bilirubin was also elevated.  She had MRCP that shows choledocholithiasis.  She was evaluated by GI and she had ERCP today that confirmed the choledocholithiasis.  He was able to be resolved with ERCP.  At the moment of my evaluation after the ERCP the patient has no abdominal pain.  She is tolerating clear liquid diet.  Surgery is consulted by Dr. Billie Ruddy in this context for evaluation and management of choledocholithiasis.  PAST MEDICAL HISTORY (PMH):  Past Medical History:  Diagnosis Date  . Cancer (HCC)    BREAST  . Hypertension      PAST SURGICAL HISTORY ([place]):  Past Surgical History:  Procedure Laterality Date  . MASTECTOMY Left      MEDICATIONS:  Prior to Admission medications   Medication Sig Start Date End Date Taking? Authorizing Provider  furosemide (LASIX) 20 MG tablet Take 10 mg by mouth at bedtime as needed for fluid.   Yes [provider]  levothyroxine (SYNTHROID) 100 MCG tablet Take 100 mcg by mouth daily. 02/06/20  Yes [provider]     ALLERGIES:  Allergies  Allergen Reactions  . Oxycodone-Acetaminophen Nausea And Vomiting     SOCIAL HISTORY:  Social History   Socioeconomic History  . Marital status: Married    Spouse name: Not on file  . Number of children: Not on file  . Years of education: Not on file  . Highest education level: Not on file  Occupational History  . Not on file  Tobacco Use  . Smoking status: Never Smoker  .  Smokeless tobacco: Never Used  Vaping Use  . Vaping Use: Never used  Substance and Sexual Activity  . Alcohol use: Never  . Drug use: Never  . Sexual activity: Not on file  Other Topics Concern  . Not on file  Social History Narrative  . Not on file   Social Determinants of Health   Financial Resource Strain:   . Difficulty of Paying Living Expenses: Not on file  Food Insecurity:   . Worried About Charity fundraiser in the Last Year: Not on file  . Ran Out of Food in the Last Year: Not on file  Transportation Needs:   . Lack of Transportation (Medical): Not on file  . Lack of Transportation (Non-Medical): Not on file  Physical Activity:   . Days of Exercise per Week: Not on file  . Minutes of Exercise per Session: Not on file  Stress:   . Feeling of Stress : Not on file  Social Connections:   . Frequency of Communication with Friends and Family: Not on file  . Frequency of Social Gatherings with Friends and Family: Not on file  . Attends Religious Services: Not on file  . Active Member of Clubs or Organizations: Not on file  . Attends Archivist Meetings: Not on file  . Marital Status: Not on file  Intimate Partner Violence:   .  Fear of Current or Ex-Partner: Not on file  . Emotionally Abused: Not on file  . Physically Abused: Not on file  . Sexually Abused: Not on file     FAMILY HISTORY:  History reviewed. No pertinent family history.   REVIEW OF SYSTEMS:  Constitutional: denies weight loss, fever, chills, or sweats  Eyes: denies any other vision changes, history of eye injury  ENT: denies sore throat, hearing problems  Respiratory: denies shortness of breath, wheezing  Cardiovascular: denies chest pain, palpitations  Gastrointestinal: positive abdominal pain, nausea and vomitnig Genitourinary: denies burning with urination or urinary frequency Musculoskeletal: denies any other joint pains or cramps  Skin: denies any other rashes or skin  discolorations  Neurological: denies any other headache, dizziness, weakness  Psychiatric: denies any other depression, anxiety   All other review of systems were negative   VITAL SIGNS:  Temp:  [97.2 F (36.2 C)-98.2 F (36.8 C)] 98.2 F (36.8 C) (08/24 1252) Pulse Rate:  [71-81] 71 (08/24 1256) Resp:  [11-19] 16 (08/24 1252) BP: (128-191)/(69-105) 155/103 (08/24 1256) SpO2:  [95 %-100 %] 95 % (08/24 1252) Weight:  [90.7 kg] 90.7 kg (08/24 1053)     Height: 5\' 5"  (165.1 cm) Weight: 90.7 kg BMI (Calculated): 33.28   INTAKE/OUTPUT:  This shift: Total I/O In: 450 [I.V.:450] Out: -   Last 2 shifts: @IOLAST2SHIFTS @   PHYSICAL EXAM:  Constitutional:  -- Normal body habitus  -- Awake, alert, and oriented x3  Eyes:  -- Pupils equally round and reactive to light  -- No scleral icterus  Ear, nose, and throat:  -- No jugular venous distension  Pulmonary:  -- No crackles  -- Equal breath sounds bilaterally -- Breathing non-labored at rest Cardiovascular:  -- S1, S2 present  -- No pericardial rubs Gastrointestinal:  -- Abdomen soft, nontender, non-distended, no guarding or rebound tenderness -- No abdominal masses appreciated, pulsatile or otherwise  Musculoskeletal and Integumentary:  -- Wounds: None appreciated -- Extremities: B/L UE and LE FROM, hands and feet warm, no edema  Neurologic:  -- Motor function: intact and symmetric -- Sensation: intact and symmetric   Labs:  CBC Latest Ref Rng & Units 02/08/2020 02/07/2020 02/06/2020  WBC 4.0 - 10.5 K/uL 4.4 5.6 6.4  Hemoglobin 12.0 - 15.0 g/dL 9.4(L) 10.4(L) 11.5(L)  Hematocrit 36 - 46 % 28.9(L) 31.0(L) 34.1(L)  Platelets 150 - 400 K/uL 172 211 277   CMP Latest Ref Rng & Units 02/08/2020 02/07/2020 02/06/2020  Glucose 70 - 99 mg/dL 92 127(H) 132(H)  BUN 8 - 23 mg/dL 10 19 20   Creatinine 0.44 - 1.00 mg/dL 0.83 0.93 1.04(H)  Sodium 135 - 145 mmol/L 141 138 139  Potassium 3.5 - 5.1 mmol/L 3.5 3.9 2.9(L)  Chloride 98 - 111  mmol/L 109 103 104  CO2 22 - 32 mmol/L 21(L) 23 24  Calcium 8.9 - 10.3 mg/dL 7.7(L) 8.8(L) 8.9  Total Protein 6.5 - 8.1 g/dL 6.1(L) 7.9 7.9  Total Bilirubin 0.3 - 1.2 mg/dL 1.9(H) 2.3(H) 1.4(H)  Alkaline Phos 38 - 126 U/L 147(H) 157(H) 121  AST 15 - 41 U/L 156(H) 275(H) 176(H)  ALT 0 - 44 U/L 115(H) 123(H) 55(H)    Imaging studies:  I personally evaluated the images of the ultrasound that shows stone in the bladder without debris or thickening.  I also evaluated the MRCP images that confirmed the choledocholithiasis.  I also evaluated the ERCP images and report.  Assessment/Plan:  69 y.o. female with choledocholithiasis, complicated by pertinent comorbidities  including hypertension.  Patient with history, physical exam and images consistent with biliary colic due to choledocholithiasis.  Patient had successful ERCP done today.  At the moment of my evaluation patient with abdominal pain no sign of pancreatitis.  Patient oriented about diagnosis and surgical management as treatment.  If there is no abdominal pain tomorrow and no sign of pancreatitis will proceed with surgical management with cholecystectomy.  Discussed the risk of surgery including post-op infxn, seroma, biloma, chronic pain, poor-delayed wound healing, retained gallstone, conversion to open procedure, post-op SBO or ileus, and need for additional procedures to address said risks.  The risks of general anesthetic including MI, CVA, sudden death or even reaction to anesthetic medications also discussed. Alternatives include continued observation.  Benefits include possible symptom relief, prevention of complications including acute cholecystitis, pancreatitis.  Arnold Long, MD

## 2020-02-08 NOTE — Anesthesia Postprocedure Evaluation (Signed)
Anesthesia Post Note  Patient: Brittney Harris  Procedure(s) Performed: ENDOSCOPIC RETROGRADE CHOLANGIOPANCREATOGRAPHY (ERCP) (N/A )  Patient location during evaluation: PACU Anesthesia Type: General Level of consciousness: awake and alert Pain management: pain level controlled Vital Signs Assessment: post-procedure vital signs reviewed and stable Respiratory status: spontaneous breathing, nonlabored ventilation, respiratory function stable and patient connected to nasal cannula oxygen Cardiovascular status: blood pressure returned to baseline and stable Postop Assessment: no apparent nausea or vomiting Anesthetic complications: no   No complications documented.   Last Vitals:  Vitals:   02/08/20 1255 02/08/20 1256  BP: (!) 191/105 (!) 155/103  Pulse: 73 71  Resp:    Temp:    SpO2:      Last Pain:  Vitals:   02/08/20 1233  TempSrc:   PainSc: 0-No pain                 Martha Clan

## 2020-02-08 NOTE — Anesthesia Procedure Notes (Signed)
Procedure Name: Intubation Date/Time: 02/08/2020 11:28 AM Performed by: Eben Burow, CRNA Pre-anesthesia Checklist: Patient identified, Emergency Drugs available, Suction available and Patient being monitored Patient Re-evaluated:Patient Re-evaluated prior to induction Oxygen Delivery Method: Circle system utilized Preoxygenation: Pre-oxygenation with 100% oxygen Induction Type: IV induction Ventilation: Mask ventilation without difficulty Laryngoscope Size: McGraph and 3 Grade View: Grade I Tube type: Oral Tube size: 7.0 mm Number of attempts: 1 Airway Equipment and Method: Stylet,  Video-laryngoscopy and LTA kit utilized Placement Confirmation: ETT inserted through vocal cords under direct vision,  positive ETCO2 and breath sounds checked- equal and bilateral Secured at: 20 cm Tube secured with: Tape Dental Injury: Teeth and Oropharynx as per pre-operative assessment

## 2020-02-09 ENCOUNTER — Encounter: Payer: Self-pay | Admitting: Hospitalist

## 2020-02-09 ENCOUNTER — Inpatient Hospital Stay: Payer: Medicare Other | Admitting: Anesthesiology

## 2020-02-09 ENCOUNTER — Encounter
Admission: EM | Disposition: A | Discharge status: Home / Self Care | Payer: Self-pay | Source: Home / Self Care | Attending: Hospitalist

## 2020-02-09 DIAGNOSIS — E039 Hypothyroidism, unspecified: Secondary | ICD-10-CM

## 2020-02-09 DIAGNOSIS — I10 Essential (primary) hypertension: Secondary | ICD-10-CM

## 2020-02-09 LAB — CBC
HCT: 35.4 % — ABNORMAL LOW (ref 36.0–46.0)
Hemoglobin: 12.1 g/dL (ref 12.0–15.0)
MCH: 29.6 pg (ref 26.0–34.0)
MCHC: 34.2 g/dL (ref 30.0–36.0)
MCV: 86.6 fL (ref 80.0–100.0)
Platelets: 225 10*3/uL (ref 150–400)
RBC: 4.09 MIL/uL (ref 3.87–5.11)
RDW: 13.3 % (ref 11.5–15.5)
WBC: 8.2 10*3/uL (ref 4.0–10.5)
nRBC: 0 % (ref 0.0–0.2)

## 2020-02-09 LAB — COMPREHENSIVE METABOLIC PANEL
ALT: 110 U/L — ABNORMAL HIGH (ref 0–44)
AST: 114 U/L — ABNORMAL HIGH (ref 15–41)
Albumin: 3.8 g/dL (ref 3.5–5.0)
Alkaline Phosphatase: 194 U/L — ABNORMAL HIGH (ref 38–126)
Anion gap: 9 (ref 5–15)
BUN: 15 mg/dL (ref 8–23)
CO2: 24 mmol/L (ref 22–32)
Calcium: 9 mg/dL (ref 8.9–10.3)
Chloride: 104 mmol/L (ref 98–111)
Creatinine, Ser: 1.14 mg/dL — ABNORMAL HIGH (ref 0.44–1.00)
GFR calc Af Amer: 56 mL/min — ABNORMAL LOW (ref >60)
GFR calc non Af Amer: 49 mL/min — ABNORMAL LOW (ref >60)
Glucose, Bld: 139 mg/dL — ABNORMAL HIGH (ref 70–99)
Potassium: 3.7 mmol/L (ref 3.5–5.1)
Sodium: 137 mmol/L (ref 135–145)
Total Bilirubin: 1.7 mg/dL — ABNORMAL HIGH (ref 0.3–1.2)
Total Protein: 7.3 g/dL (ref 6.5–8.1)

## 2020-02-09 LAB — MAGNESIUM: Magnesium: 1.8 mg/dL (ref 1.7–2.4)

## 2020-02-09 SURGERY — CHOLECYSTECTOMY, ROBOT-ASSISTED, LAPAROSCOPIC
Anesthesia: General

## 2020-02-09 MED ORDER — PIPERACILLIN-TAZOBACTAM 3.375 G IVPB
INTRAVENOUS | Status: AC
  Administered 2020-02-09: 3.375 g via INTRAVENOUS
  Filled 2020-02-09: qty 50

## 2020-02-09 MED ORDER — DEXAMETHASONE SODIUM PHOSPHATE 10 MG/ML IJ SOLN
INTRAMUSCULAR | Status: DC | PRN
  Administered 2020-02-09: 10 mg via INTRAVENOUS

## 2020-02-09 MED ORDER — FENTANYL CITRATE (PF) 100 MCG/2ML IJ SOLN
INTRAMUSCULAR | Status: DC | PRN
  Administered 2020-02-09: 100 ug via INTRAVENOUS

## 2020-02-09 MED ORDER — ONDANSETRON HCL 4 MG/2ML IJ SOLN: 4.0000 mg | Freq: Once | INTRAMUSCULAR | Status: DC | PRN

## 2020-02-09 MED ORDER — SODIUM CHLORIDE 0.9 % IV SOLN
INTRAVENOUS | Status: DC | PRN
  Administered 2020-02-09: 250 mL via INTRAVENOUS

## 2020-02-09 MED ORDER — BUPIVACAINE-EPINEPHRINE 0.25% -1:200000 IJ SOLN
INTRAMUSCULAR | Status: DC | PRN
  Administered 2020-02-09: 20 mL

## 2020-02-09 MED ORDER — SUGAMMADEX SODIUM 200 MG/2ML IV SOLN
INTRAVENOUS | Status: DC | PRN
  Administered 2020-02-09 (×2): 200 mg via INTRAVENOUS

## 2020-02-09 MED ORDER — ONDANSETRON HCL 4 MG/2ML IJ SOLN
INTRAMUSCULAR | Status: AC
  Filled 2020-02-09: qty 2

## 2020-02-09 MED ORDER — HYDRALAZINE HCL 20 MG/ML IJ SOLN: 10.0000 mg | Freq: Four times a day (QID) | INTRAMUSCULAR | Status: DC | PRN

## 2020-02-09 MED ORDER — EPHEDRINE SULFATE 50 MG/ML IJ SOLN
INTRAMUSCULAR | Status: DC | PRN
  Administered 2020-02-09: 20 mg via INTRAVENOUS

## 2020-02-09 MED ORDER — FENTANYL CITRATE (PF) 100 MCG/2ML IJ SOLN
INTRAMUSCULAR | Status: AC
Start: 2020-02-09 — End: 2020-02-09
  Administered 2020-02-09: 25 ug via INTRAVENOUS
  Filled 2020-02-09: qty 2

## 2020-02-09 MED ORDER — LIDOCAINE HCL (CARDIAC) PF 100 MG/5ML IV SOSY
PREFILLED_SYRINGE | INTRAVENOUS | Status: DC | PRN
  Administered 2020-02-09: 80 mg via INTRAVENOUS

## 2020-02-09 MED ORDER — PHENYLEPHRINE HCL (PRESSORS) 10 MG/ML IV SOLN
INTRAVENOUS | Status: DC | PRN
  Administered 2020-02-09: 200 ug via INTRAVENOUS

## 2020-02-09 MED ORDER — FENTANYL CITRATE (PF) 100 MCG/2ML IJ SOLN
25.0000 ug | INTRAMUSCULAR | Status: DC | PRN
  Administered 2020-02-09 (×4): 25 ug via INTRAVENOUS

## 2020-02-09 MED ORDER — PROPOFOL 10 MG/ML IV BOLUS
INTRAVENOUS | Status: AC
  Filled 2020-02-09: qty 20

## 2020-02-09 MED ORDER — LACTATED RINGERS IV SOLN: INTRAVENOUS | Status: DC | PRN

## 2020-02-09 MED ORDER — KETAMINE HCL 50 MG/ML IJ SOLN
INTRAMUSCULAR | Status: AC
  Filled 2020-02-09: qty 10

## 2020-02-09 MED ORDER — PROPOFOL 10 MG/ML IV BOLUS
INTRAVENOUS | Status: DC | PRN
  Administered 2020-02-09: 150 mg via INTRAVENOUS

## 2020-02-09 MED ORDER — KETAMINE HCL 10 MG/ML IJ SOLN
INTRAMUSCULAR | Status: DC | PRN
  Administered 2020-02-09: 100 mg via INTRAVENOUS

## 2020-02-09 MED ORDER — LIDOCAINE HCL (PF) 2 % IJ SOLN
INTRAMUSCULAR | Status: AC
  Filled 2020-02-09: qty 5

## 2020-02-09 MED ORDER — ROCURONIUM BROMIDE 100 MG/10ML IV SOLN
INTRAVENOUS | Status: DC | PRN
  Administered 2020-02-09: 80 mg via INTRAVENOUS

## 2020-02-09 MED ORDER — PROMETHAZINE HCL 25 MG/ML IJ SOLN
12.5000 mg | Freq: Four times a day (QID) | INTRAMUSCULAR | Status: DC | PRN
  Administered 2020-02-09: 12.5 mg via INTRAVENOUS
  Filled 2020-02-09: qty 1

## 2020-02-09 MED ORDER — DEXAMETHASONE SODIUM PHOSPHATE 10 MG/ML IJ SOLN
INTRAMUSCULAR | Status: AC
  Filled 2020-02-09: qty 1

## 2020-02-09 MED ORDER — MORPHINE SULFATE (PF) 2 MG/ML IV SOLN: 2.0000 mg | INTRAVENOUS | Status: DC | PRN

## 2020-02-09 MED ORDER — ROCURONIUM BROMIDE 10 MG/ML (PF) SYRINGE
PREFILLED_SYRINGE | INTRAVENOUS | Status: AC
  Filled 2020-02-09: qty 10

## 2020-02-09 MED ORDER — EPHEDRINE 5 MG/ML INJ
INTRAVENOUS | Status: AC
  Filled 2020-02-09: qty 10

## 2020-02-09 MED ORDER — FENTANYL CITRATE (PF) 100 MCG/2ML IJ SOLN
INTRAMUSCULAR | Status: AC
  Filled 2020-02-09: qty 2

## 2020-02-09 MED ORDER — BUPIVACAINE-EPINEPHRINE (PF) 0.25% -1:200000 IJ SOLN
INTRAMUSCULAR | Status: AC
  Filled 2020-02-09: qty 30

## 2020-02-09 SURGICAL SUPPLY — 54 items
BAG INFUSER PRESSURE 100CC (MISCELLANEOUS) IMPLANT
BLADE SURG SZ11 CARB STEEL (BLADE) ×4 IMPLANT
CANISTER SUCT 1200ML W/VALVE (MISCELLANEOUS) ×4 IMPLANT
CANNULA REDUC XI 12-8 STAPL (CANNULA) ×3
CANNULA REDUC XI 12-8MM STAPL (CANNULA) ×1
CANNULA REDUCER 12-8 DVNC XI (CANNULA) ×2 IMPLANT
CHLORAPREP W/TINT 26 (MISCELLANEOUS) ×4 IMPLANT
CLIP VESOLOCK MED LG 6/CT (CLIP) ×4 IMPLANT
COVER WAND RF STERILE (DRAPES) ×4 IMPLANT
DECANTER SPIKE VIAL GLASS SM (MISCELLANEOUS) ×8 IMPLANT
DEFOGGER SCOPE WARMER CLEARIFY (MISCELLANEOUS) ×4 IMPLANT
DERMABOND ADVANCED (GAUZE/BANDAGES/DRESSINGS) ×2
DERMABOND ADVANCED .7 DNX12 (GAUZE/BANDAGES/DRESSINGS) ×2 IMPLANT
DRAPE ARM DVNC X/XI (DISPOSABLE) ×8 IMPLANT
DRAPE COLUMN DVNC XI (DISPOSABLE) ×2 IMPLANT
DRAPE DA VINCI XI ARM (DISPOSABLE) ×16
DRAPE DA VINCI XI COLUMN (DISPOSABLE) ×4
ELECT REM PT RETURN 9FT ADLT (ELECTROSURGICAL) ×4
ELECTRODE REM PT RTRN 9FT ADLT (ELECTROSURGICAL) ×2 IMPLANT
GLOVE BIO SURGEON STRL SZ 6.5 (GLOVE) ×6 IMPLANT
GLOVE BIO SURGEONS STRL SZ 6.5 (GLOVE) ×2
GLOVE BIOGEL PI IND STRL 6.5 (GLOVE) ×4 IMPLANT
GLOVE BIOGEL PI INDICATOR 6.5 (GLOVE) ×4
GOWN STRL REUS W/ TWL LRG LVL3 (GOWN DISPOSABLE) ×6 IMPLANT
GOWN STRL REUS W/TWL LRG LVL3 (GOWN DISPOSABLE) ×12
GRASPER SUT TROCAR 14GX15 (MISCELLANEOUS) IMPLANT
IRRIGATOR SUCT 8 DISP DVNC XI (IRRIGATION / IRRIGATOR) IMPLANT
IRRIGATOR SUCTION 8MM XI DISP (IRRIGATION / IRRIGATOR)
IV NS 1000ML (IV SOLUTION)
IV NS 1000ML BAXH (IV SOLUTION) IMPLANT
KIT PINK PAD W/HEAD ARE REST (MISCELLANEOUS) ×4
KIT PINK PAD W/HEAD ARM REST (MISCELLANEOUS) ×2 IMPLANT
LABEL OR SOLS (LABEL) ×4 IMPLANT
NDL INSUFFLATION 14GA 120MM (NEEDLE) ×2 IMPLANT
NEEDLE HYPO 22GX1.5 SAFETY (NEEDLE) ×4 IMPLANT
NEEDLE INSUFFLATION 14GA 120MM (NEEDLE) ×4 IMPLANT
NS IRRIG 500ML POUR BTL (IV SOLUTION) ×4 IMPLANT
OBTURATOR OPTICAL STANDARD 8MM (TROCAR) ×4
OBTURATOR OPTICAL STND 8 DVNC (TROCAR) ×2
OBTURATOR OPTICALSTD 8 DVNC (TROCAR) ×2 IMPLANT
PACK LAP CHOLECYSTECTOMY (MISCELLANEOUS) ×4 IMPLANT
POUCH SPECIMEN RETRIEVAL 10MM (ENDOMECHANICALS) ×4 IMPLANT
SEAL CANN UNIV 5-8 DVNC XI (MISCELLANEOUS) ×6 IMPLANT
SEAL XI 5MM-8MM UNIVERSAL (MISCELLANEOUS) ×12
SET TUBE SMOKE EVAC HIGH FLOW (TUBING) ×4 IMPLANT
SOLUTION ELECTROLUBE (MISCELLANEOUS) ×4 IMPLANT
STAPLER CANNULA SEAL DVNC XI (STAPLE) ×2 IMPLANT
STAPLER CANNULA SEAL XI (STAPLE) ×4
SUT MNCRL 4-0 (SUTURE) ×4
SUT MNCRL 4-0 27XMFL (SUTURE) ×2
SUT VIC AB 3-0 SH 27 (SUTURE)
SUT VIC AB 3-0 SH 27X BRD (SUTURE) IMPLANT
SUT VICRYL 0 AB UR-6 (SUTURE) IMPLANT
SUTURE MNCRL 4-0 27XMF (SUTURE) ×2 IMPLANT

## 2020-02-09 NOTE — Transfer of Care (Signed)
Immediate Anesthesia Transfer of Care Note  Patient: Brittney Harris  Procedure(s) Performed: XI ROBOTIC ASSISTED LAPAROSCOPIC CHOLECYSTECTOMY (N/A ) INDOCYANINE GREEN FLUORESCENCE IMAGING (ICG)  Patient Location: PACU  Anesthesia Type:General  Level of Consciousness: sedated  Airway & Oxygen Therapy: Patient Spontanous Breathing and Patient connected to face mask oxygen  Post-op Assessment: Report given to RN and Post -op Vital signs reviewed and stable  Post vital signs: Reviewed and stable  Last Vitals:  Vitals Value Taken Time  BP 178/80 02/09/20 1114  Temp    Pulse 87 02/09/20 1115  Resp 23 02/09/20 1115  SpO2 100 % 02/09/20 1115  Vitals shown include unvalidated device data.  Last Pain:  Vitals:   02/09/20 0916  TempSrc: Temporal  PainSc: 0-No pain         Complications: No complications documented.

## 2020-02-09 NOTE — Progress Notes (Signed)
Vonda Antigua, MD [address] [place], [place], [place], [address] [address], [place], [place], [place], [address] Phone: [phone]  Fax: [phone]   Subjective:  Patient reports some abdominal soreness but does not have any increase in abdominal pain since her ERCP yesterday.  No nausea or vomiting  Objective: Exam: Vital signs in last 24 hours: Vitals:   02/08/20 2001 02/08/20 2342 02/09/20 0431 02/09/20 0916  BP: (!) 167/83 (!) 153/89 (!) 166/90 (!) 146/63  Pulse: 81 84 82 85  Resp: 20 20 20 20   Temp: 98.4 F (36.9 C) 98.5 F (36.9 C) 98.4 F (36.9 C) 98.1 F (36.7 C)  TempSrc: Oral Oral Oral Temporal  SpO2: 98% 97% 96% 98%  Weight:      Height:       Weight change:   Intake/Output Summary (Last 24 hours) at 02/09/2020 1010 Last data filed at 02/09/2020 0300 Gross per 24 hour  Intake 659.44 ml  Output 200 ml  Net 459.44 ml    General: No acute distress, AAO x3 Abd: Soft, NT/ND, No HSM Skin: Warm, no rashes Neck: Supple, Trachea midline   Lab Results: Lab Results  Component Value Date   WBC 8.2 02/09/2020   HGB 12.1 02/09/2020   HCT 35.4 (L) 02/09/2020   MCV 86.6 02/09/2020   PLT 225 02/09/2020   Micro Results: Recent Results (from the past 240 hour(s))  SARS Coronavirus 2 by RT PCR (hospital order, performed in [place] hospital lab) Nasopharyngeal Nasopharyngeal Swab     Status: None   Collection Time: 02/07/20  3:26 AM   Specimen: Nasopharyngeal Swab  Result Value Ref Range Status   SARS Coronavirus 2 NEGATIVE NEGATIVE Final    Comment: (NOTE) SARS-CoV-2 target nucleic acids are NOT DETECTED.  The SARS-CoV-2 RNA is generally detectable in upper and lower respiratory specimens during the acute phase of infection. The lowest concentration of SARS-CoV-2 viral copies this assay can detect is 250 copies / mL. A negative result does not preclude SARS-CoV-2 infection and should not be used as the sole basis for treatment or  other patient management decisions.  A negative result may occur with improper specimen collection / handling, submission of specimen other than nasopharyngeal swab, presence of viral mutation(s) within the areas targeted by this assay, and inadequate number of viral copies (<250 copies / mL). A negative result must be combined with clinical observations, patient history, and epidemiological information.  Fact Sheet for Patients:   StrictlyIdeas.no  Fact Sheet for Healthcare Providers: BankingDealers.co.za  This test is not yet approved or  cleared by the [place] FDA and has been authorized for detection and/or diagnosis of SARS-CoV-2 by FDA under an Emergency Use Authorization (EUA).  This EUA will remain in effect (meaning this test can be used) for the duration of the COVID-19 declaration under Section 564(b)(1) of the Act, 21 U.S.C. section 360bbb-3(b)(1), unless the authorization is terminated or revoked sooner.  Performed at [facility], [address], [place], [place] [address]    Studies/Results: DG C-Arm 1-60 Min-No Report  Result Date: 02/08/2020 Fluoroscopy was utilized by the requesting physician.  No radiographic interpretation.   Medications:  Scheduled Meds: . [MAR Hold] indomethacin  100 mg Rectal Once  . [MAR Hold] levothyroxine  100 mcg Oral Q0600   Continuous Infusions: . [MAR Hold] piperacillin-tazobactam 3.375 g (02/09/20 0523)   PRN Meds:.[MAR Hold] acetaminophen **OR** [MAR Hold] acetaminophen, bupivacaine-EPINEPHrine, [MAR Hold] hydrALAZINE, [MAR Hold] labetalol, [MAR Hold]  morphine  injection, [MAR Hold] ondansetron **OR** [MAR Hold] ondansetron (ZOFRAN) IV   Assessment: Active Problems:   Choledocholithiasis   Hypertension   Hypothyroidism    Plan: Bilirubin mildly improved since ERCP yesterday No evidence of pancreatitis at this time Continue with surgical plans for  cholecystectomy Continue daily CMP Continue monitoring abdominal exam and abdominal symptoms daily   LOS: 2 days   Vonda Antigua, MD 02/09/2020, 10:10 AM

## 2020-02-09 NOTE — Anesthesia Procedure Notes (Signed)
Procedure Name: Intubation Performed by: Gaynelle Cage, CRNA Pre-anesthesia Checklist: Patient identified, Emergency Drugs available, Suction available and Patient being monitored Patient Re-evaluated:Patient Re-evaluated prior to induction Oxygen Delivery Method: Circle system utilized Preoxygenation: Pre-oxygenation with 100% oxygen Induction Type: IV induction Ventilation: Mask ventilation without difficulty and Oral airway inserted - appropriate to patient size Laryngoscope Size: McGraph and 3 Grade View: Grade I Tube type: Oral Tube size: 7.0 mm Number of attempts: 1 Airway Equipment and Method: Stylet and Oral airway Placement Confirmation: ETT inserted through vocal cords under direct vision,  positive ETCO2 and breath sounds checked- equal and bilateral Secured at: 21 cm Tube secured with: Tape Dental Injury: Teeth and Oropharynx as per pre-operative assessment

## 2020-02-09 NOTE — Progress Notes (Signed)
PROGRESS NOTE    Brittney Harris  NWG:[id number] DOB: 02-10-1951 DOA: 02/07/2020 PCP: System, Provider Not In   Chief complaint.  Abdominal pain.  Brief Narrative: Brittney Harris Plowmanis a 69 y.o.Caucasian femalewith medical history significant forHTN, hypothyroidism and history of left breast cancer status post chemoradiation and mastectomy with reconstruction in 2012 who presents to the emergency room with sudden onset mid and right upper quadrant pain of moderate to severe intensity, nonradiating, crampy in nature that started after eating at a restaurant. It was associated with 3 episodes of vomiting of gastric contents. No associated diarrhea. No fever or chills.  Patient was seen by GI, ERCP was performed, removed a 3 mm stone in the common bile duct.  She is also put on Zosyn.  8/25.  Cholecystectomy performed today.   Assessment & Plan:   Active Problems:   Choledocholithiasis   Hypertension   Hypothyroidism  #1.  Acute biliary colic secondary to choledocholithiasis. Status post ERCP and cholecystectomy. Patient still has significant nausea vomiting after surgery.  We will keep patient overnight, most likely discharge home tomorrow.  She will not need antibiotics after discharge.  #2. essential hypertension. Continue to follow.  3.  Hypokalemia. Recheck a BMP tomorrow.  Potassium normal today.        DVT prophylaxis:   Code Status: Full Family Communication: Husband at bedside. Disposition Plan:  . Patient came from:Home           . Anticipated d/c place: Home tomorrow. . Barriers to d/c OR conditions which need to be met to effect a safe d/c:   Consultants:   General surgery and GI.  Procedures: ERCP Cholecystectomy. Antimicrobials: Zosyn  Subjective: Patient is status post cholecystectomy.  Currently significant nausea vomiting.  Abdominal pain under control.  No fever or chills.  Objective: Vitals:   02/09/20 1240 02/09/20 1245 02/09/20 1250  02/09/20 1324  BP: (!) 166/82  (!) 159/84 (!) 165/93  Pulse: 77 78 75 84  Resp: '11 12 13 16  ' Temp:    97.6 F (36.4 C)  TempSrc:      SpO2: 96% 96% 98% 99%  Weight:      Height:        Intake/Output Summary (Last 24 hours) at 02/09/2020 1407 Last data filed at 02/09/2020 1300 Gross per 24 hour  Intake 309.44 ml  Output 275 ml  Net 34.44 ml   Filed Weights   02/06/20 1529 02/08/20 0715 02/08/20 1053  Weight: 90.7 kg 90.7 kg 90.7 kg    Examination:  General exam: Appears calm and comfortable  Respiratory system: Clear to auscultation. Respiratory effort normal. Cardiovascular system: S1 & S2 heard, RRR. No JVD, murmurs, rubs, gallops or clicks. No pedal edema. Gastrointestinal system: Abdomen is nondistended, soft and RUQ tender. No organomegaly or masses felt. Normal bowel sounds heard. Central nervous system: Alert and oriented. No focal neurological deficits. Extremities: Symmetric  Skin: No rashes, lesions or ulcers Psychiatry: Judgement and insight appear normal. Mood & affect appropriate.     Data Reviewed: I have personally reviewed following labs and imaging studies  CBC: Recent Labs  Lab 02/06/20 1535 02/07/20 1111 02/08/20 0257 02/09/20 0510  WBC 6.4 5.6 4.4 8.2  HGB 11.5* 10.4* 9.4* 12.1  HCT 34.1* 31.0* 28.9* 35.4*  MCV 86.3 87.1 91.2 86.6  PLT 277 211 172 578   Basic Metabolic Panel: Recent Labs  Lab 02/06/20 1535 02/07/20 0326 02/07/20 1111 02/08/20 0257 02/09/20 0510  NA 139 138  --  141 137  K 2.9* 3.9  --  3.5 3.7  CL 104 103  --  109 104  CO2 24 23  --  21* 24  GLUCOSE 132* 127*  --  92 139*  BUN 20 19  --  10 15  CREATININE 1.04* 0.93  --  0.83 1.14*  CALCIUM 8.9 8.8*  --  7.7* 9.0  MG  --   --  1.8 1.8 1.8   GFR: Estimated Creatinine Clearance: 51.1 mL/min (A) (by C-G formula based on SCr of 1.14 mg/dL (H)). Liver Function Tests: Recent Labs  Lab 02/06/20 1809 02/07/20 0326 02/08/20 0257 02/09/20 0510  AST 176* 275* 156*  114*  ALT 55* 123* 115* 110*  ALKPHOS 121 157* 147* 194*  BILITOT 1.4* 2.3* 1.9* 1.7*  PROT 7.9 7.9 6.1* 7.3  ALBUMIN 4.1 4.1 3.2* 3.8   No results for input(s): LIPASE, AMYLASE in the last 168 hours. No results for input(s): AMMONIA in the last 168 hours. Coagulation Profile: No results for input(s): INR, PROTIME in the last 168 hours. Cardiac Enzymes: No results for input(s): CKTOTAL, CKMB, CKMBINDEX, TROPONINI in the last 168 hours. BNP (last 3 results) No results for input(s): PROBNP in the last 8760 hours. HbA1C: No results for input(s): HGBA1C in the last 72 hours. CBG: No results for input(s): GLUCAP in the last 168 hours. Lipid Profile: No results for input(s): CHOL, HDL, LDLCALC, TRIG, CHOLHDL, LDLDIRECT in the last 72 hours. Thyroid Function Tests: No results for input(s): TSH, T4TOTAL, FREET4, T3FREE, THYROIDAB in the last 72 hours. Anemia Panel: No results for input(s): VITAMINB12, FOLATE, FERRITIN, TIBC, IRON, RETICCTPCT in the last 72 hours. Sepsis Labs: No results for input(s): PROCALCITON, LATICACIDVEN in the last 168 hours.  Recent Results (from the past 240 hour(s))  SARS Coronavirus 2 by RT PCR (hospital order, performed in [facility] lab) Nasopharyngeal Nasopharyngeal Swab     Status: None   Collection Time: 02/07/20  3:26 AM   Specimen: Nasopharyngeal Swab  Result Value Ref Range Status   SARS Coronavirus 2 NEGATIVE NEGATIVE Final    Comment: (NOTE) SARS-CoV-2 target nucleic acids are NOT DETECTED.  The SARS-CoV-2 RNA is generally detectable in upper and lower respiratory specimens during the acute phase of infection. The lowest concentration of SARS-CoV-2 viral copies this assay can detect is 250 copies / mL. A negative result does not preclude SARS-CoV-2 infection and should not be used as the sole basis for treatment or other patient management decisions.  A negative result may occur with improper specimen collection / handling, submission  of specimen other than nasopharyngeal swab, presence of viral mutation(s) within the areas targeted by this assay, and inadequate number of viral copies (<250 copies / mL). A negative result must be combined with clinical observations, patient history, and epidemiological information.  Fact Sheet for Patients:   StrictlyIdeas.no  Fact Sheet for Healthcare Providers: BankingDealers.co.za  This test is not yet approved or  cleared by the [place] FDA and has been authorized for detection and/or diagnosis of SARS-CoV-2 by FDA under an Emergency Use Authorization (EUA).  This EUA will remain in effect (meaning this test can be used) for the duration of the COVID-19 declaration under Section 564(b)(1) of the Act, 21 U.S.C. section 360bbb-3(b)(1), unless the authorization is terminated or revoked sooner.  Performed at [facility], [address], [place], [place] [address]          Radiology Studies: DG C-Arm 1-60 Min-No Report  Result  Date: 02/08/2020 Fluoroscopy was utilized by the requesting physician.  No radiographic interpretation.        Scheduled Meds: . indomethacin  100 mg Rectal Once  . levothyroxine  100 mcg Oral Q0600   Continuous Infusions: . piperacillin-tazobactam 3.375 g (02/09/20 0523)     LOS: 2 days    Time spent: 26 minutes    Sharen Hones, MD [facility]   To contact the attending provider between 7A-7P or the covering provider during after hours 7P-7A, please log into the web site [email or web address] and access using universal [place] password for that web site. If you do not have the password, please call the hospital operator.  02/09/2020, 2:07 PM

## 2020-02-09 NOTE — Interval H&P Note (Signed)
History and Physical Interval Note:  02/09/2020 9:19 AM  Brittney Harris  has presented today for surgery, with the diagnosis of choledocholithiasis status post ERCP.  The various methods of treatment have been discussed with the patient and family. After consideration of risks, benefits and other options for treatment, the patient has consented to  Procedure(s): XI ROBOTIC [place] (N/A) as a surgical intervention.  The patient's history has been reviewed, patient examined, no change in status, stable for surgery.  I have reviewed the patient's chart and labs.  Questions were answered to the patient's satisfaction.     Herbert Pun

## 2020-02-09 NOTE — Op Note (Signed)
Preoperative diagnosis: Choledocholithiasis  Postoperative diagnosis: Choledocholithiasis with acute cholecystitis.   Procedure: Robotic Assisted Laparoscopic Cholecystectomy.   Anesthesia: GETA   Surgeon: Dr. Windell Moment  Wound Classification: Clean Contaminated  Indications: Patient is a 69 y.o. female developed right upper quadrant pain, nausea and on workup was found to have choledocholithiasis. ERCP done with clearance of stones. Robotic Assisted Laparoscopic cholecystectomy was elected.  Findings: Significant pericholecystic fluid and edema Critical view of safety achieved Cystic duct and artery identified, ligated and divided Adequate hemostasis  Description of procedure: The patient was placed on the operating table in the supine position. General anesthesia was induced. A time-out was completed verifying correct patient, procedure, site, positioning, and implant(s) and/or special equipment prior to beginning this procedure. An orogastric tube was placed. The abdomen was prepped and draped in the usual sterile fashion.  An incision was made in a natural skin line below the umbilicus.  The fascia was elevated and the Veress needle inserted. Proper position was confirmed by aspiration and saline meniscus test.  The abdomen was insufflated with carbon dioxide to a pressure of 15 mmHg. The patient tolerated insufflation well. A 8-mm trocar was then inserted in optiview fashion.  The laparoscope was inserted and the abdomen inspected. No injuries from initial trocar placement were noted. Additional trocars were then inserted in the following locations: an 8-mm trocar in the left lateral abdomen, and another two 8-mm trocars to the right side of the abdomen 5 cm appart. The umbilical trocar was changed to a 12 mm trocar all under direct visualization. The abdomen was inspected and no abnormalities were found. The table was placed in the reverse Trendelenburg position with the right side  up. The robotic arms were docked and target anatomy identified. Instrument inserted under direct visualization.  Filmy adhesions between the gallbladder and omentum, duodenum and transverse colon were lysed with electrocautery. The dome of the gallbladder was grasped with a prograsp and retracted over the dome of the liver. The infundibulum was also grasped with an atraumatic grasper and retracted toward the right lower quadrant. This maneuver exposed Calot's triangle. The peritoneum overlying the gallbladder infundibulum was then incised and the cystic duct and cystic artery identified and circumferentially dissected. Critical view of safety reviewed before ligating any structure. Firefly images taken to visualize biliary ducts. The cystic duct and cystic artery were then doubly clipped and divided close to the gallbladder.  The gallbladder was then dissected from its peritoneal attachments by electrocautery. Hemostasis was checked and the gallbladder and contained stones were removed using an endoscopic retrieval bag. The gallbladder was passed off the table as a specimen. The gallbladder fossa was copiously irrigated with saline and hemostasis was obtained. There was no evidence of bleeding from the gallbladder fossa or cystic artery or leakage of the bile from the cystic duct stump. Secondary trocars were removed under direct vision. No bleeding was noted. The robotic arms were undoked. The scope was withdrawn and the umbilical trocar removed. The abdomen was allowed to collapse. The fascia of the 58mm trocar sites was closed with figure-of-eight 0 vicryl sutures. The skin was closed with subcuticular sutures of 4-0 monocryl and topical skin adhesive. The orogastric tube was removed.  The patient tolerated the procedure well and was taken to the postanesthesia care unit in stable condition.   Specimen: Gallbladder  Complications: None  EBL: 5 mL

## 2020-02-09 NOTE — Anesthesia Preprocedure Evaluation (Signed)
Anesthesia Evaluation  Patient identified by MRN, date of birth, ID band Patient awake    Reviewed: Allergy & Precautions, H&P , NPO status , Patient's Chart, lab work & pertinent test results, reviewed documented beta blocker date and time   History of Anesthesia Complications Negative for: history of anesthetic complications  Airway Mallampati: I  TM Distance: >3 FB Neck ROM: full    Dental  (+) Caps, Teeth Intact, Dental Advidsory Given   Pulmonary neg shortness of breath, neg sleep apnea, neg COPD, Recent URI , Resolved,    Pulmonary exam normal breath sounds clear to auscultation       Cardiovascular Exercise Tolerance: Good hypertension, negative cardio ROS Normal cardiovascular exam Rhythm:regular Rate:Normal     Neuro/Psych negative neurological ROS  negative psych ROS   GI/Hepatic negative GI ROS, Neg liver ROS,   Endo/Other  neg diabetesHypothyroidism   Renal/GU negative Renal ROS  negative genitourinary   Musculoskeletal   Abdominal   Peds  Hematology negative hematology ROS (+)   Anesthesia Other Findings Past Medical History: No date: Cancer ([place])   Reproductive/Obstetrics negative OB ROS                             Anesthesia Physical  Anesthesia Plan  ASA: II  Anesthesia Plan: General   Post-op Pain Management:    Induction: Intravenous  PONV Risk Score and Plan: 3 and Ondansetron, Dexamethasone and Treatment may vary due to age or medical condition  Airway Management Planned: Oral ETT  Additional Equipment:   Intra-op Plan:   Post-operative Plan: Extubation in OR  Informed Consent: I have reviewed the patients History and Physical, chart, labs and discussed the procedure including the risks, benefits and alternatives for the proposed anesthesia with the patient or authorized representative who has indicated his/her understanding and acceptance.      Dental Advisory Given  Plan Discussed with: Anesthesiologist, CRNA and Surgeon  Anesthesia Plan Comments:         Anesthesia Quick Evaluation

## 2020-02-10 LAB — COMPREHENSIVE METABOLIC PANEL
ALT: 98 U/L — ABNORMAL HIGH (ref 0–44)
AST: 100 U/L — ABNORMAL HIGH (ref 15–41)
Albumin: 3.7 g/dL (ref 3.5–5.0)
Alkaline Phosphatase: 153 U/L — ABNORMAL HIGH (ref 38–126)
Anion gap: 11 (ref 5–15)
BUN: 16 mg/dL (ref 8–23)
CO2: 25 mmol/L (ref 22–32)
Calcium: 8.8 mg/dL — ABNORMAL LOW (ref 8.9–10.3)
Chloride: 103 mmol/L (ref 98–111)
Creatinine, Ser: 1.07 mg/dL — ABNORMAL HIGH (ref 0.44–1.00)
GFR calc Af Amer: >60 mL/min (ref >60)
GFR calc non Af Amer: 53 mL/min — ABNORMAL LOW (ref >60)
Glucose, Bld: 106 mg/dL — ABNORMAL HIGH (ref 70–99)
Potassium: 3.5 mmol/L (ref 3.5–5.1)
Sodium: 139 mmol/L (ref 135–145)
Total Bilirubin: 1.4 mg/dL — ABNORMAL HIGH (ref 0.3–1.2)
Total Protein: 7.4 g/dL (ref 6.5–8.1)

## 2020-02-10 LAB — CBC WITH DIFFERENTIAL/PLATELET
Abs Immature Granulocytes: 0.09 10*3/uL — ABNORMAL HIGH (ref 0.00–0.07)
Basophils Absolute: 0 10*3/uL (ref 0.0–0.1)
Basophils Relative: 0 %
Eosinophils Absolute: 0 10*3/uL (ref 0.0–0.5)
Eosinophils Relative: 0 %
HCT: 36.7 % (ref 36.0–46.0)
Hemoglobin: 12.8 g/dL (ref 12.0–15.0)
Immature Granulocytes: 0 %
Lymphocytes Relative: 10 %
Lymphs Abs: 1.9 10*3/uL (ref 0.7–4.0)
MCH: 29.8 pg (ref 26.0–34.0)
MCHC: 34.9 g/dL (ref 30.0–36.0)
MCV: 85.5 fL (ref 80.0–100.0)
Monocytes Absolute: 1.6 10*3/uL — ABNORMAL HIGH (ref 0.1–1.0)
Monocytes Relative: 8 %
Neutro Abs: 16.5 10*3/uL — ABNORMAL HIGH (ref 1.7–7.7)
Neutrophils Relative %: 82 %
Platelets: 255 10*3/uL (ref 150–400)
RBC: 4.29 MIL/uL (ref 3.87–5.11)
RDW: 13.9 % (ref 11.5–15.5)
WBC: 20.1 10*3/uL — ABNORMAL HIGH (ref 4.0–10.5)
nRBC: 0 % (ref 0.0–0.2)

## 2020-02-10 LAB — MAGNESIUM: Magnesium: 1.8 mg/dL (ref 1.7–2.4)

## 2020-02-10 LAB — SURGICAL PATHOLOGY

## 2020-02-10 MED ORDER — ONDANSETRON HCL 4 MG PO TABS: 4.0000 mg | ORAL_TABLET | Freq: Three times a day (TID) | ORAL | 0 refills | Status: DC | PRN

## 2020-02-10 MED ORDER — HYDROCODONE-ACETAMINOPHEN 5-325 MG PO TABS
1.0000 | ORAL_TABLET | ORAL | 0 refills | Status: AC | PRN
Start: 2020-02-10 — End: 2020-02-13

## 2020-02-10 NOTE — Progress Notes (Signed)
[place] Hospital Day(s): 3.   Post op day(s): 1 Day Post-Op.   Interval History: Patient seen and examined, no acute events or new complaints overnight. Patient reports feeling better this morning.  She denies nausea or vomiting.  She reports she tolerated full liquids.  She denies any fever or chills.  Vital signs in last 24 hours: [min-max] current  Temp:  [97.2 F (36.2 C)-99.8 F (37.7 C)] 99.8 F (37.7 C) (08/26 0552) Pulse Rate:  [74-95] 95 (08/26 0552) Resp:  [11-24] 16 (08/26 0552) BP: (146-185)/(63-93) 147/66 (08/26 0552) SpO2:  [92 %-100 %] 97 % (08/26 0552)     Height: 5\' 5"  (165.1 cm) Weight: 90.7 kg BMI (Calculated): 33.28   Physical Exam:  Constitutional: alert, cooperative and no distress  Respiratory: breathing non-labored at rest  Cardiovascular: regular rate and sinus rhythm  Gastrointestinal: soft, non-tender, and non-distended.  Wounds are dry and clean  Labs:  CBC Latest Ref Rng & Units 02/10/2020 02/09/2020 02/08/2020  WBC 4.0 - 10.5 K/uL 20.1(H) 8.2 4.4  Hemoglobin 12.0 - 15.0 g/dL 12.8 12.1 9.4(L)  Hematocrit 36 - 46 % 36.7 35.4(L) 28.9(L)  Platelets 150 - 400 K/uL 255 225 172   CMP Latest Ref Rng & Units 02/10/2020 02/09/2020 02/08/2020  Glucose 70 - 99 mg/dL 106(H) 139(H) 92  BUN 8 - 23 mg/dL 16 15 10   Creatinine 0.44 - 1.00 mg/dL 1.07(H) 1.14(H) 0.83  Sodium 135 - 145 mmol/L 139 137 141  Potassium 3.5 - 5.1 mmol/L 3.5 3.7 3.5  Chloride 98 - 111 mmol/L 103 104 109  CO2 22 - 32 mmol/L 25 24 21(L)  Calcium 8.9 - 10.3 mg/dL 8.8(L) 9.0 7.7(L)  Total Protein 6.5 - 8.1 g/dL 7.4 7.3 6.1(L)  Total Bilirubin 0.3 - 1.2 mg/dL 1.4(H) 1.7(H) 1.9(H)  Alkaline Phos 38 - 126 U/L 153(H) 194(H) 147(H)  AST 15 - 41 U/L 100(H) 114(H) 156(H)  ALT 0 - 44 U/L 98(H) 110(H) 115(H)    Imaging studies: No new pertinent imaging studies   Assessment/Plan:  69 y.o. female with choledocholithiasis with cholecystitis 1 Day Post-Op s/p robotic assisted  laparoscopic cholecystectomy.  Patient tolerated the procedure well.  Bilirubin continue trending down.  Alkaline phosphatase trending down.  AST and ALT decreasing.  Pain controlled.  No nausea or vomiting.  Tolerating diet.  From surgical standpoint patient can be discharge and I will follow her in 2 weeks in my office.  Arnold Long, MD

## 2020-02-10 NOTE — Progress Notes (Signed)
Megan Salon Combes  A and O x 4 VSS. Pt tolerating diet well. No complaints of pain or nausea. IV removed intact, prescriptions given. Pt voices understanding of discharge instructions with no further questions. Pt discharged via wheelchair with axillary.   Allergies as of 02/10/2020      Reactions   Oxycodone-acetaminophen Nausea And Vomiting      Medication List    TAKE these medications   furosemide 20 MG tablet Commonly known as: LASIX Take 10 mg by mouth at bedtime as needed for fluid.   HYDROcodone-acetaminophen 5-325 MG tablet Commonly known as: Norco Take 1 tablet by mouth every 4 (four) hours as needed for up to 3 days for moderate pain.   levothyroxine 100 MCG tablet Commonly known as: SYNTHROID Take 100 mcg by mouth daily.   ondansetron 4 MG tablet Commonly known as: Zofran Take 1 tablet (4 mg total) by mouth every 8 (eight) hours as needed for nausea or vomiting.       Vitals:   02/09/20 2154 02/10/20 0552  BP: (!) 169/92 (!) 147/66  Pulse: 90 95  Resp: 20 16  Temp: 97.7 F (36.5 C) 99.8 F (37.7 C)  SpO2: 100% 97%    Nereyda Bowler Payton Mccallum

## 2020-02-10 NOTE — Anesthesia Postprocedure Evaluation (Signed)
Anesthesia Post Note  Patient: Brittney Harris  Procedure(s) Performed: XI ROBOTIC ASSISTED LAPAROSCOPIC CHOLECYSTECTOMY (N/A ) INDOCYANINE GREEN FLUORESCENCE IMAGING (ICG)  Patient location during evaluation: PACU Anesthesia Type: General Level of consciousness: awake and alert and oriented Pain management: pain level controlled Vital Signs Assessment: post-procedure vital signs reviewed and stable Respiratory status: spontaneous breathing Cardiovascular status: blood pressure returned to baseline Anesthetic complications: no   No complications documented.   Last Vitals:  Vitals:   02/09/20 2154 02/10/20 0552  BP: (!) 169/92 (!) 147/66  Pulse: 90 95  Resp: 20 16  Temp: 36.5 C 37.7 C  SpO2: 100% 97%    Last Pain:  Vitals:   02/10/20 0552  TempSrc: Oral  PainSc:                  Brittney Harris

## 2020-02-10 NOTE — Discharge Summary (Signed)
Physician Discharge Summary  Patient ID: Brittney Harris MRN: [id number] DOB/AGE: 1951/04/16 69 y.o.  Admit date: 02/07/2020 Discharge date: 02/10/2020  Admission Diagnoses:  Discharge Diagnoses:  Active Problems:   Choledocholithiasis   Hypertension   Hypothyroidism Acute cholecystitis.  Discharged Condition: good  Hospital Course:  Brittney Balla Plowmanis a 69 y.o.Caucasianfemalewith medical history significant forHTN, hypothyroidism and history of left breast cancer status post chemoradiation and mastectomy with reconstruction in 2012 who presents to the emergency room with sudden onset mid and right upper quadrant pain of moderate to severe intensity, nonradiating, crampy in nature that started after eating at a restaurant. It was associated with 3 episodes of vomiting of gastric contents. No associated diarrhea. No fever or chills.  Patient was seen by GI, ERCP was performed, removed a 3 mm stone in the common bile duct.  She is also put on Zosyn.  8/25.  Cholecystectomy performed today.  8/26.  Patient had a significant nausea vomiting after surgery yesterday, symptom has resolved today.  He is being seen by surgery, cleared for discharge.  Per surgical report, patient also had acute cholecystitis.  Since she already had a cholecystectomy, no additional antibiotics needed.    Consults: GI and general surgery  Significant Diagnostic Studies:  MRI ABDOMEN WITHOUT AND WITH CONTRAST (INCLUDING MRCP)  TECHNIQUE: Multiplanar multisequence MR imaging of the abdomen was performed both before and after the administration of intravenous contrast. Heavily T2-weighted images of the biliary and pancreatic ducts were obtained, and three-dimensional MRCP images were rendered by post processing.  CONTRAST:  7.70mL GADAVIST GADOBUTROL 1 MMOL/ML IV SOLN  COMPARISON:  None.  FINDINGS: Lower chest: No acute findings.  Hepatobiliary: No focal liver lesions identified. There are  multiple small stones identified layering within the gallbladder measuring up to 4 mm. Gallbladder wall is upper limits of normal in thickness measuring 3 mm. No surrounding inflammatory changes. Mild intrahepatic biliary dilatation. Fusiform dilatation of the common bile duct is identified which measures up to 1.2 cm, image 11/4. There may be a single tiny stone identified within the distal common bile duct measuring approximately 3 mm, image 11/14. No additional signs of choledocholithiasis or obstructing mass identified.  Pancreas: The main duct is upper limits of normal caliber measuring 3 mm, image 41/2. No pancreatic inflammation or mass identified.  Spleen:  Within normal limits in size and appearance.  Adrenals/Urinary Tract: No masses identified. No evidence of hydronephrosis.  Stomach/Bowel: Small to the moderate size hiatal hernia noted. The visualized bowel loops within the upper abdomen are unremarkable.  Vascular/Lymphatic: No pathologically enlarged lymph nodes identified. No abdominal aortic aneurysm demonstrated.  Other:  No free fluid or fluid collections  Musculoskeletal: No suspicious bone lesions identified.  IMPRESSION: 1. Gallstones. Gallbladder wall is upper limits of normal in thickness. No surrounding inflammatory changes to suggest acute cholecystitis. 2. Fusiform dilatation of the common bile duct which measures up to 1.2 cm. Mild intrahepatic biliary dilatation is also noted. Suspect a single tiny stone identified with the distal common bile duct measuring approximately 3 mm. No additional signs of choledocholithiasis or obstructing mass identified. 3. Small to moderate size hiatal hernia.   Electronically Signed   By: Kerby Moors M.D.   On: 02/07/2020 06:40   Treatments: antibiotics: Zosyn ERCP and cholecystectomy.  Discharge Exam: Blood pressure (!) 147/66, pulse 95, temperature 99.8 F (37.7 C), temperature source Oral,  resp. rate 16, height 5\' 5"  (1.651 m), weight 90.7 kg, SpO2 97 %. General appearance: alert and cooperative  Resp: clear to auscultation bilaterally Cardio: regular rate and rhythm, S1, S2 normal, no murmur, click, rub or gallop GI: soft, non-tender; bowel sounds normal; no masses,  no organomegaly Extremities: extremities normal, atraumatic, no cyanosis or edema  Disposition: Discharge disposition: 01-Home or Self Care       Discharge Instructions    Diet - low sodium heart healthy   Complete by: As directed    Increase activity slowly   Complete by: As directed      Allergies as of 02/10/2020      Reactions   Oxycodone-acetaminophen Nausea And Vomiting      Medication List    TAKE these medications   furosemide 20 MG tablet Commonly known as: LASIX Take 10 mg by mouth at bedtime as needed for fluid.   HYDROcodone-acetaminophen 5-325 MG tablet Commonly known as: Norco Take 1 tablet by mouth every 4 (four) hours as needed for up to 3 days for moderate pain.   levothyroxine 100 MCG tablet Commonly known as: SYNTHROID Take 100 mcg by mouth daily.   ondansetron 4 MG tablet Commonly known as: Zofran Take 1 tablet (4 mg total) by mouth every 8 (eight) hours as needed for nausea or vomiting.       Follow-up Information    Herbert Pun, MD Follow up in 2 week(s).   Specialty: General Surgery Why: Follow up after gallbladder surgery Contact information: [place] [address] [phone]               Signed: Sharen Hones 02/10/2020, 1:00 PM

## 2020-02-10 NOTE — Discharge Instructions (Signed)

## 2020-02-10 NOTE — Care Management Important Message (Signed)
Important Message  Patient Details  Name: Brittney Harris MRN: [id number] Date of Birth: 05/04/1951   Medicare Important Message Given:  Yes     Dannette Barbara 02/10/2020, 1:30 PM

## 2020-02-10 NOTE — Progress Notes (Signed)
Vonda Antigua, MD [address], [place], [place], [place], [address] [address], [place], [place], [place], [address] Phone: [phone]  Fax: [phone]   Subjective: Patient is status post cholecystectomy yesterday.  Reports some abdominal soreness today but no pain.  No nausea or vomiting.  No fever or chills.   Objective: Exam: Vital signs in last 24 hours: Vitals:   02/09/20 1250 02/09/20 1324 02/09/20 2154 02/10/20 0552  BP: (!) 159/84 (!) 165/93 (!) 169/92 (!) 147/66  Pulse: 75 84 90 95  Resp: 13 16 20 16   Temp:  97.6 F (36.4 C) 97.7 F (36.5 C) 99.8 F (37.7 C)  TempSrc:   Oral Oral  SpO2: 98% 99% 100% 97%  Weight:      Height:       Weight change:   Intake/Output Summary (Last 24 hours) at 02/10/2020 1344 Last data filed at 02/10/2020 5009 Gross per 24 hour  Intake 865.92 ml  Output 1600 ml  Net -734.08 ml    General: No acute distress, AAO x3 Abd: Soft, NT/ND, No HSM Skin: Warm, no rashes Neck: Supple, Trachea midline   Lab Results: Lab Results  Component Value Date   WBC 20.1 (H) 02/10/2020   HGB 12.8 02/10/2020   HCT 36.7 02/10/2020   MCV 85.5 02/10/2020   PLT 255 02/10/2020   Micro Results: Recent Results (from the past 240 hour(s))  SARS Coronavirus 2 by RT PCR (hospital order, performed in [facility] hospital lab) Nasopharyngeal Nasopharyngeal Swab     Status: None   Collection Time: 02/07/20  3:26 AM   Specimen: Nasopharyngeal Swab  Result Value Ref Range Status   SARS Coronavirus 2 NEGATIVE NEGATIVE Final    Comment: (NOTE) SARS-CoV-2 target nucleic acids are NOT DETECTED.  The SARS-CoV-2 RNA is generally detectable in upper and lower respiratory specimens during the acute phase of infection. The lowest concentration of SARS-CoV-2 viral copies this assay can detect is 250 copies / mL. A negative result does not preclude SARS-CoV-2 infection and should not be used as the sole basis for treatment or other patient  management decisions.  A negative result may occur with improper specimen collection / handling, submission of specimen other than nasopharyngeal swab, presence of viral mutation(s) within the areas targeted by this assay, and inadequate number of viral copies (<250 copies / mL). A negative result must be combined with clinical observations, patient history, and epidemiological information.  Fact Sheet for Patients:   StrictlyIdeas.no  Fact Sheet for Healthcare Providers: BankingDealers.co.za  This test is not yet approved or  cleared by the [place] FDA and has been authorized for detection and/or diagnosis of SARS-CoV-2 by FDA under an Emergency Use Authorization (EUA).  This EUA will remain in effect (meaning this test can be used) for the duration of the COVID-19 declaration under Section 564(b)(1) of the Act, 21 U.S.C. section 360bbb-3(b)(1), unless the authorization is terminated or revoked sooner.  Performed at [facility], [address], [place], [place] [address]    Studies/Results: No results found. Medications:  Scheduled Meds: . indomethacin  100 mg Rectal Once  . levothyroxine  100 mcg Oral Q0600   Continuous Infusions: . sodium chloride Stopped (02/09/20 1441)  . piperacillin-tazobactam 3.375 g (02/10/20 0530)   PRN Meds:.sodium chloride, acetaminophen **OR** acetaminophen, hydrALAZINE, labetalol, morphine injection, ondansetron **OR** ondansetron (ZOFRAN) IV, promethazine   Assessment: Active Problems:   Choledocholithiasis   Hypertension   Hypothyroidism    Plan: Patient doing well post  ERCP and post cholecystectomy Liver enzymes improved but still mildly elevated Patient agrees to follow-up with [place] as an outpatient to repeat liver enzymes to ensure they continue to improve  Continue to avoid hepatotoxic drugs   LOS: 3 days   Vonda Antigua, MD 02/10/2020, 1:44 PM

## 2020-03-08 DIAGNOSIS — R55 Syncope and collapse: Secondary | ICD-10-CM | POA: Insufficient documentation

## 2020-03-08 DIAGNOSIS — D699 Hemorrhagic condition, unspecified: Secondary | ICD-10-CM | POA: Insufficient documentation

## 2020-03-08 DIAGNOSIS — C50419 Malignant neoplasm of upper-outer quadrant of unspecified female breast: Secondary | ICD-10-CM | POA: Insufficient documentation

## 2020-03-08 DIAGNOSIS — D62 Acute posthemorrhagic anemia: Secondary | ICD-10-CM | POA: Insufficient documentation

## 2020-03-08 DIAGNOSIS — M25559 Pain in unspecified hip: Secondary | ICD-10-CM | POA: Insufficient documentation

## 2020-03-09 ENCOUNTER — Ambulatory Visit (INDEPENDENT_AMBULATORY_CARE_PROVIDER_SITE_OTHER): Payer: Medicare Other | Admitting: Gastroenterology

## 2020-03-09 ENCOUNTER — Other Ambulatory Visit: Payer: Self-pay

## 2020-03-09 ENCOUNTER — Encounter: Payer: Self-pay | Admitting: Gastroenterology

## 2020-03-09 VITALS — BP 155/88 | HR 73 | Temp 98.1°F | Ht 65.0 in | Wt 212.2 lb

## 2020-03-09 DIAGNOSIS — K5900 Constipation, unspecified: Secondary | ICD-10-CM | POA: Diagnosis not present

## 2020-03-09 DIAGNOSIS — R748 Abnormal levels of other serum enzymes: Secondary | ICD-10-CM | POA: Diagnosis not present

## 2020-03-09 NOTE — Progress Notes (Signed)
Brittney Harris, [place] [address]  Main: [phone]  Fax: [phone]   Gastroenterology Consultation  Referring Provider:     Dr. Marinus Maw Primary Care Physician:  Guadalupe Maple, MD Reason for Consultation:     Choledocholithiasis        HPI:    Chief Complaint  Patient presents with  . RUQ abdominal pain    Brittney Harris is a 69 y.o. y/o female referred for consultation & management  by Dr. Marinus Maw, Shelbie Hutching, MD.  Patient recently hospitalized with abdominal pain and found to have choledocholithiasis.  She underwent ERCP on February 08, 2020 with Dr. Allen Norris and cholecystectomy after that.  Patient reports doing well since the surgery.  The patient denies abdominal or flank pain, anorexia, nausea or vomiting, dysphagia, change in bowel habits or black or bloody stools or weight loss.  Denies any postcholecystectomy diarrhea  Does report mild abdominal discomfort in the left lower quadrant in one spot, that is very intermittent and gets better after bowel movement.  Has had some constipation since hospital discharge but has started using Metamucil with good results and abdominal discomfort has decreased with this as well.  States has been getting colorectal cancer screening with her PCP with Cologuard testing and has one scheduled for April of next year as per patient.  Has had one done previously as well that was negative as per patient.  Results not available to me.  Denies any family history of colon cancer  Past Medical History:  Diagnosis Date  . Cancer (HCC)    BREAST  . Hypertension     Past Surgical History:  Procedure Laterality Date  . ERCP N/A 02/08/2020   Procedure: ENDOSCOPIC RETROGRADE CHOLANGIOPANCREATOGRAPHY (ERCP);  Surgeon: Lucilla Lame, MD;  Location: [facility] ENDOSCOPY;  Service: Endoscopy;  Laterality: N/A;  . MASTECTOMY Left     Prior to Admission medications   Medication Sig Start Date End Date Taking?  Authorizing Provider  furosemide (LASIX) 20 MG tablet Take 10 mg by mouth at bedtime as needed for fluid.   Yes [provider]  HYDROcodone-acetaminophen (NORCO/VICODIN) 5-325 MG tablet Take 1 tablet by mouth every 4 (four) hours as needed. 02/27/20  Yes [provider]  levothyroxine (SYNTHROID) 100 MCG tablet Take 100 mcg by mouth daily. 02/06/20  Yes [provider]    No family history on file.   Social History   Tobacco Use  . Smoking status: Never Smoker  . Smokeless tobacco: Never Used  Vaping Use  . Vaping Use: Never used  Substance Use Topics  . Alcohol use: Never  . Drug use: Never    Allergies as of 03/09/2020 - Review Complete 03/09/2020  Allergen Reaction Noted  . Oxycodone-acetaminophen Nausea And Vomiting 02/07/2020    Review of Systems:    All systems reviewed and negative except where noted in HPI.   Physical Exam:  BP (!) 155/88   Pulse 73   Temp 98.1 F (36.7 C) (Oral)   Ht 5\' 5"  (1.651 m)   Wt 212 lb 3.2 oz (96.3 kg)   BMI 35.31 kg/m  No LMP recorded. Patient has had a hysterectomy. Psych:  Alert and cooperative. Normal mood and affect. General:   Alert,  Well-developed, well-nourished, pleasant and cooperative in NAD Head:  Normocephalic and atraumatic. Eyes:  Sclera clear, no icterus.   Conjunctiva pink. Ears:  Normal auditory acuity. Nose:  No deformity, discharge, or lesions. Mouth:  No deformity or lesions,oropharynx pink & moist. Neck:  Supple; no masses or thyromegaly. Abdomen:  Normal bowel sounds.  No bruits.  Soft, non-tender and non-distended without masses, hepatosplenomegaly or hernias noted.  No guarding or rebound tenderness.    Msk:  Symmetrical without gross deformities. Good, equal movement & strength bilaterally. Pulses:  Normal pulses noted. Extremities:  No clubbing or edema.  No cyanosis. Neurologic:  Alert and oriented x3;  grossly normal neurologically. Skin:  Intact without significant lesions or  rashes. No jaundice. Lymph Nodes:  No significant cervical adenopathy. Psych:  Alert and cooperative. Normal mood and affect.   Labs: CBC    Component Value Date/Time   WBC 20.1 (H) 02/10/2020 0558   RBC 4.29 02/10/2020 0558   HGB 12.8 02/10/2020 0558   HCT 36.7 02/10/2020 0558   PLT 255 02/10/2020 0558   MCV 85.5 02/10/2020 0558   MCH 29.8 02/10/2020 0558   MCHC 34.9 02/10/2020 0558   RDW 13.9 02/10/2020 0558   LYMPHSABS 1.9 02/10/2020 0558   MONOABS 1.6 (H) 02/10/2020 0558   EOSABS 0.0 02/10/2020 0558   BASOSABS 0.0 02/10/2020 0558   CMP     Component Value Date/Time   NA 139 02/10/2020 0558   K 3.5 02/10/2020 0558   CL 103 02/10/2020 0558   CO2 25 02/10/2020 0558   GLUCOSE 106 (H) 02/10/2020 0558   BUN 16 02/10/2020 0558   CREATININE 1.07 (H) 02/10/2020 0558   CALCIUM 8.8 (L) 02/10/2020 0558   PROT 7.4 02/10/2020 0558   ALBUMIN 3.7 02/10/2020 0558   AST 100 (H) 02/10/2020 0558   ALT 98 (H) 02/10/2020 0558   ALKPHOS 153 (H) 02/10/2020 0558   BILITOT 1.4 (H) 02/10/2020 0558   GFRNONAA 53 (L) 02/10/2020 0558   GFRAA >60 02/10/2020 0558    Imaging Studies: No results found.  Assessment and Plan:   Brittney Harris is a 69 y.o. y/o female has been referred for choledocholithiasis  Patient is status post ERCP and cholecystectomy and doing well  Liver enzymes need to be repeated as they were elevated on hospital discharge  If completely normalized, no further work-up needed.  If still abnormal, will need work-up for elevated liver enzymes  High-fiber diet discussed  patient is taking Metamucil which is also helping with constipation.  Okay to take for the next few weeks and then discontinue.  If symptoms do not resolve or recur patient advised to let us know and verbalized understanding.  Abdominal discomfort has resolved since bowel movements are better with Metamucil as well and is likely due to underlying constipation  Patient encouraged to continue close  follow-up for CRC screening with PCP as she prefers Cologuard testing and has one scheduled for April of next year.  Refuses colonoscopy for CRC screening.  I did discuss the benefits of colonoscopy and limitations of Cologuard testing with patient and she verbalized understanding   Dr Brittney Antigua  Speech recognition software was used to dictate the above note.

## 2020-03-10 ENCOUNTER — Telehealth: Payer: Self-pay

## 2020-03-10 LAB — HEPATIC FUNCTION PANEL
ALT: 18 IU/L (ref 0–32)
AST: 34 IU/L (ref 0–40)
Albumin: 4 g/dL (ref 3.8–4.8)
Alkaline Phosphatase: 94 IU/L (ref 44–121)
Bilirubin Total: 0.5 mg/dL (ref 0.0–1.2)
Bilirubin, Direct: 0.17 mg/dL (ref 0.00–0.40)
Total Protein: 7.1 g/dL (ref 6.0–8.5)

## 2020-03-10 NOTE — Telephone Encounter (Signed)
Called patient but was not able to leave a voicemail. I will try to call her again.

## 2020-03-10 NOTE — Telephone Encounter (Signed)
-----   Message from [name], MD sent at 03/10/2020 10:24 AM EDT ----- Brittney Harris please let the patient know, her liver enzymes are completely normal at this time

## 2020-03-13 NOTE — Telephone Encounter (Signed)
Called patient but was not able to leave her a voicemail. I will send her a letter.

## 2021-04-04 IMAGING — MR MR ABDOMEN WO/W CM MRCP
19 of 20 series · 45 of 48 positions shown · IV contrast (7.5ml Gadavist)
Comparison: None.

CLINICAL DATA: History of breast cancer. Acute onset of right upper
abdominal pain with nausea.

EXAM:
MRI ABDOMEN WITHOUT AND WITH CONTRAST (INCLUDING MRCP)
TECHNIQUE: Multiplanar multisequence MR imaging of the abdomen was performed
both before and after the administration of intravenous contrast.
Heavily T2-weighted images of the biliary and pancreatic ducts were
obtained, and three-dimensional MRCP images were rendered by post
processing.
CONTRAST:  7.5mL GADAVIST GADOBUTROL 1 MMOL/ML IV SOLN

[Series 3: T2 · coronal · 6.0mm · 1.19mm/px · 2 of 32 slices shown (1 of 2)]
[im 1/32]
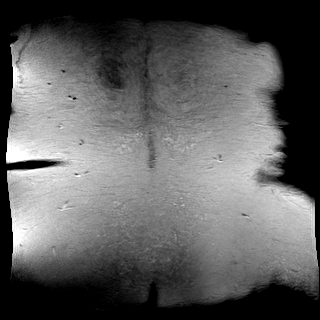
[im 32/32]
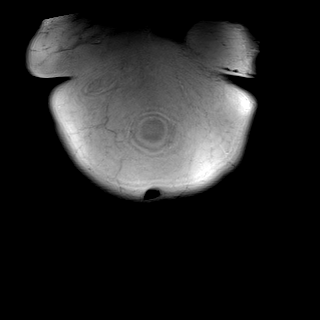

[Series 4: T2 · axial · 6.0mm · 1.19mm/px · z∈[-65,+144]mm · 2 of 30 slices shown (2 of 2)]
[im 1/30]
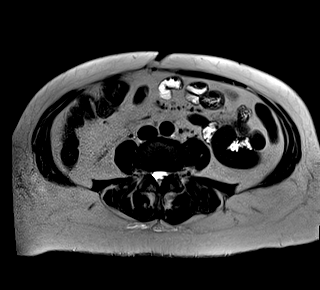
[im 30/30]
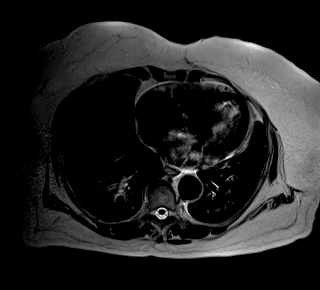

[Series 5: T1 · axial · 6.0mm · 0.74mm/px · z∈[-65,+144]mm · 2 of 30 slices shown (1 of 2)]
[im 1/30]
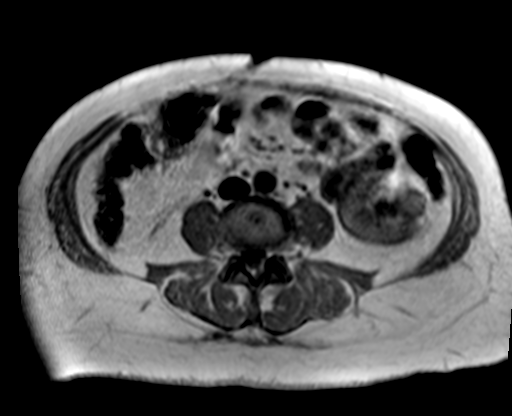
[im 30/30]
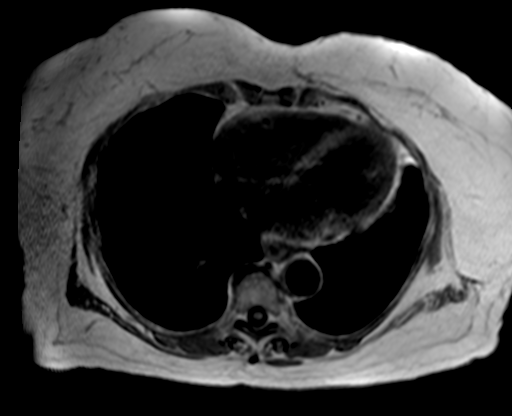

[Series 5: T1 · axial · 6.0mm · 0.74mm/px · 1 of 30 slices shown (2 of 2)]
[im 1/30]
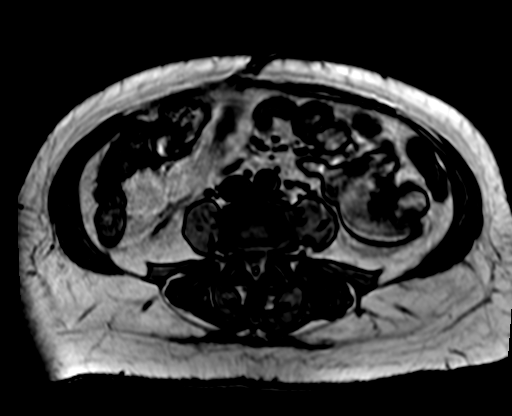

[Series 8: T2 fat-sat · axial · 6.0mm · 1.19mm/px · 1 of 30 slices shown]
[im 1/30]
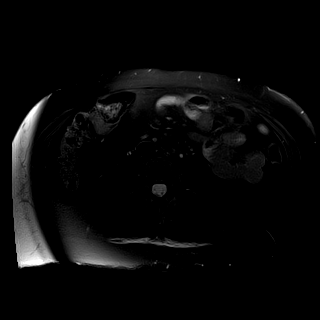

[Series 9: ax dwi_tracew · axial · 6.0mm · 1.42mm/px · z∈[-51,+158]mm · 4 of 90 slices shown]
[im 1/90]
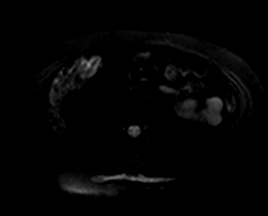
[im 30/90]
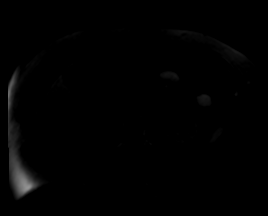
[im 60/90]
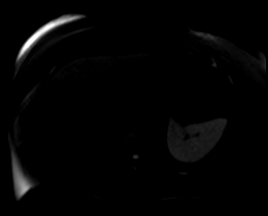
[im 90/90]
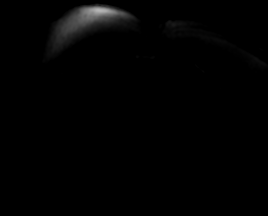

[Series 10: ax dwi_adc · axial · 6.0mm · 1.42mm/px · 1 of 30 slices shown]
[im 1/30]
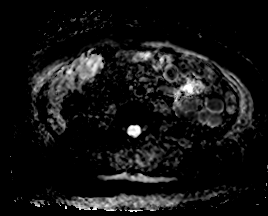

[Series 14: MRCP · coronal · 3.0mm · 1.12mm/px · 1 of 21 slices shown]
[im 1/21]
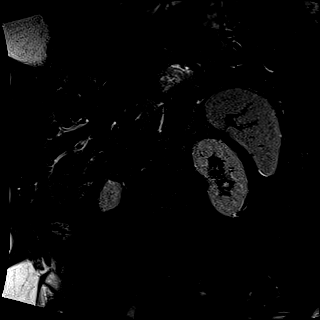

[Series 15: radials · coronal · 50.0mm · 0.78mm/px · 1 of 5 slices shown]
[im 1/5]
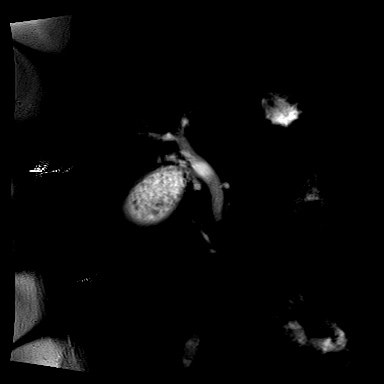

[Series 16: T1 dynamic fat-sat · axial · non-contrast · 3.0mm · 1.19mm/px · z∈[-61,+152]mm · 3 of 72 slices shown (1 of 5)]
[im 1/72]
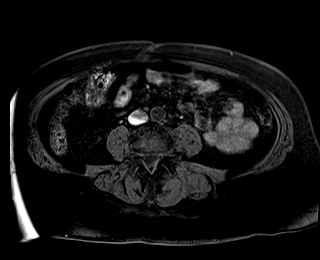
[im 36/72]
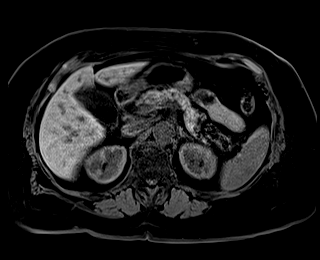
[im 72/72]
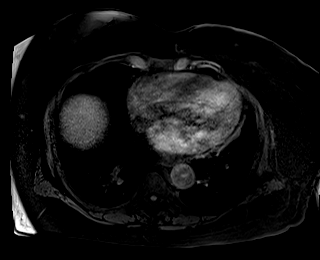

[Series 17: T1 dynamic fat-sat post-contrast · axial · 3.0mm · 1.19mm/px · z∈[-61,+152]mm · 3 of 72 slices shown (1 of 4)]
[im 1/72]
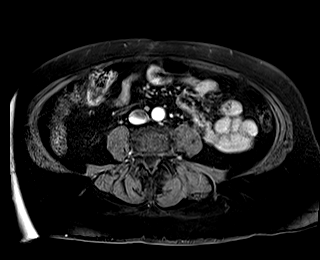
[im 36/72]
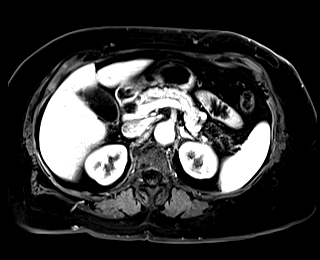
[im 72/72]
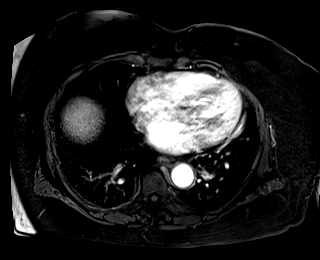

[Series 18: T1 dynamic fat-sat · axial · 3.0mm · 1.19mm/px · z∈[-61,+152]mm · 3 of 72 slices shown (2 of 5)]
[im 1/72]
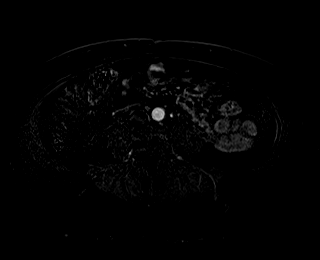
[im 36/72]
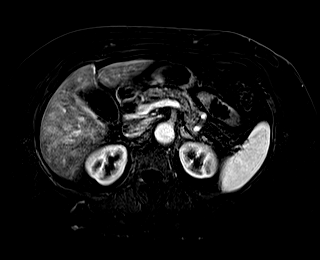
[im 72/72]
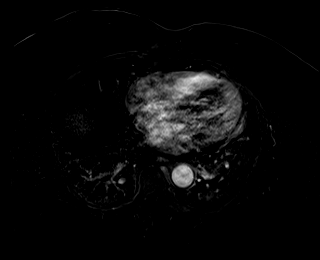

[Series 19: T1 dynamic fat-sat post-contrast · axial · 3.0mm · 1.19mm/px · z∈[-61,+152]mm · 3 of 72 slices shown (2 of 4)]
[im 1/72]
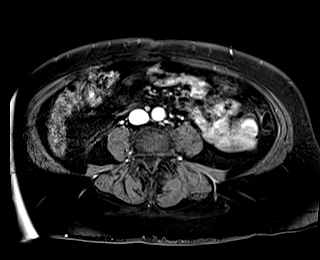
[im 36/72]
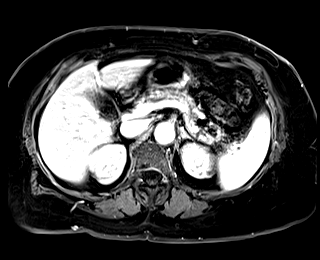
[im 72/72]
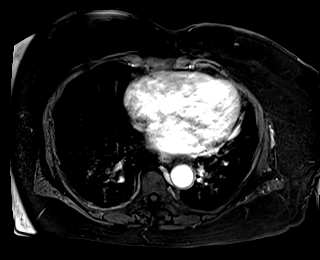

[Series 20: T1 dynamic fat-sat · axial · 3.0mm · 1.19mm/px · z∈[-61,+152]mm · 3 of 72 slices shown (3 of 5)]
[im 1/72]
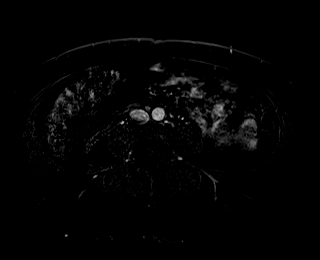
[im 36/72]
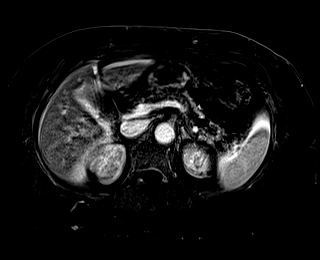
[im 72/72]
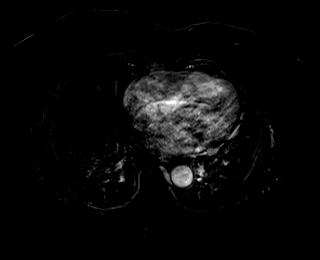

[Series 21: T1 dynamic fat-sat post-contrast · axial · 3.0mm · 1.19mm/px · z∈[-61,+152]mm · 3 of 72 slices shown (3 of 4)]
[im 1/72]
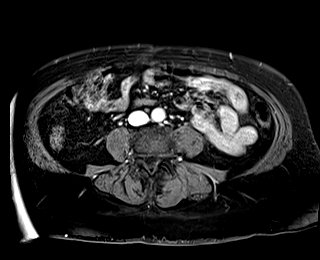
[im 36/72]
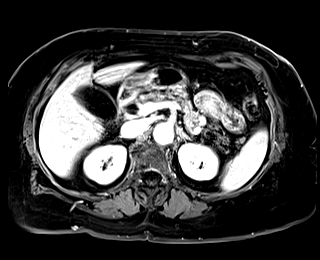
[im 72/72]
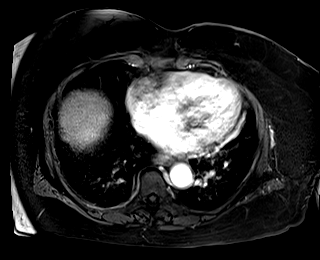

[Series 22: T1 dynamic fat-sat · axial · 3.0mm · 1.19mm/px · z∈[-61,+152]mm · 3 of 72 slices shown (4 of 5)]
[im 1/72]
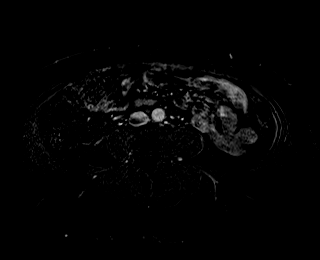
[im 36/72]
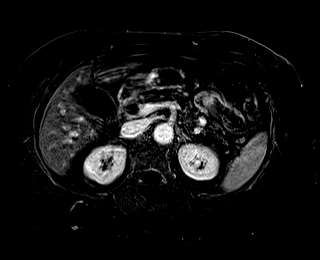
[im 72/72]
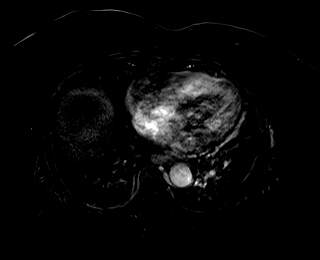

[Series 23: T1 dynamic post-contrast · coronal · 3.0mm · 1.31mm/px · 3 of 72 slices shown]
[im 1/72]
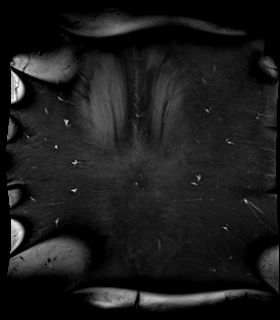
[im 36/72]
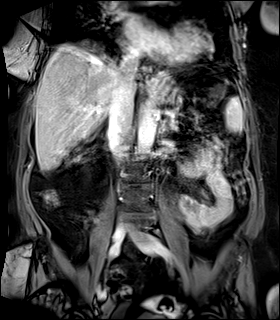
[im 72/72]
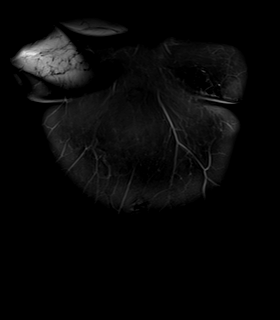

[Series 24: T1 dynamic fat-sat post-contrast · axial · 3.0mm · 1.19mm/px · z∈[-61,+152]mm · 3 of 72 slices shown (4 of 4)]
[im 1/72]
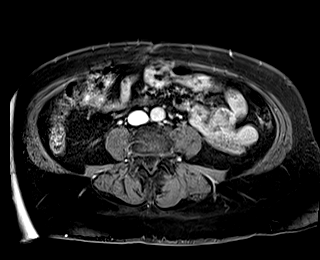
[im 36/72]
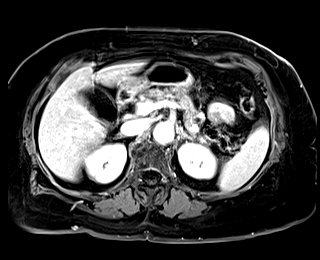
[im 72/72]
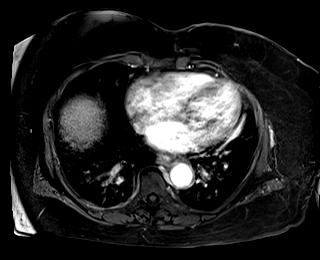

[Series 25: T1 dynamic fat-sat · axial · 3.0mm · 1.19mm/px · z∈[-61,+152]mm · 3 of 72 slices shown (5 of 5)]
[im 1/72]
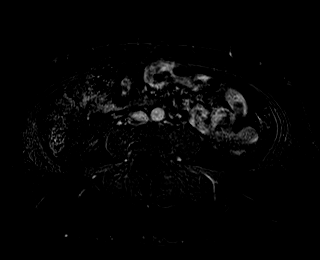
[im 36/72]
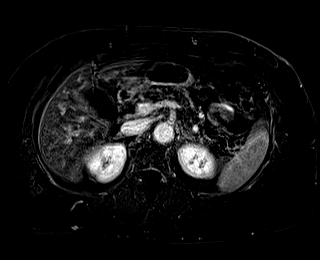
[im 72/72]
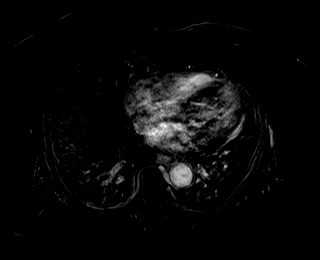

[45 of 48 positions shown; findings below may reference images not displayed]

FINDINGS: Lower chest: No acute findings.

Hepatobiliary: No focal liver lesions identified. There are multiple
small stones identified layering within the gallbladder measuring up
to 4 mm. Gallbladder wall is upper limits of normal in thickness
measuring 3 mm. No surrounding inflammatory changes. Mild
intrahepatic biliary dilatation. Fusiform dilatation of the common
bile duct is identified which measures up to 1.2 cm, image [DATE].
There may be a single tiny stone identified within the distal common
bile duct measuring approximately 3 mm, image [DATE]. No additional
signs of choledocholithiasis or obstructing mass identified.

Pancreas: The main duct is upper limits of normal caliber measuring
3 mm, image 41/2. No pancreatic inflammation or mass identified.

Spleen:  Within normal limits in size and appearance.

Adrenals/Urinary Tract: No masses identified. No evidence of
hydronephrosis.

Stomach/Bowel: Small to the moderate size hiatal hernia noted. The
visualized bowel loops within the upper abdomen are unremarkable.

Vascular/Lymphatic: No pathologically enlarged lymph nodes
identified. No abdominal aortic aneurysm demonstrated.

Other:  No free fluid or fluid collections

Musculoskeletal: No suspicious bone lesions identified.
IMPRESSION: 1. Gallstones. Gallbladder wall is upper limits of normal in
thickness. No surrounding inflammatory changes to suggest acute
cholecystitis.
2. Fusiform dilatation of the common bile duct which measures up to
1.2 cm. Mild intrahepatic biliary dilatation is also noted. Suspect
a single tiny stone identified with the distal common bile duct
measuring approximately 3 mm. No additional signs of
choledocholithiasis or obstructing mass identified.
3. Small to moderate size hiatal hernia.
